# Patient Record
Sex: Male | Born: 1970 | Race: Black or African American | Hispanic: No | Marital: Married | Smoking: Current every day smoker
Health system: Southern US, Community
[De-identification: ages and names within clinical notes are randomized; demographics above are authoritative.]

## PROBLEM LIST (undated history)

## (undated) DIAGNOSIS — I10 Essential (primary) hypertension: Secondary | ICD-10-CM

## (undated) DIAGNOSIS — G459 Transient cerebral ischemic attack, unspecified: Secondary | ICD-10-CM

## (undated) HISTORY — PX: BRAIN SURGERY: SHX531

## (undated) HISTORY — DX: Transient cerebral ischemic attack, unspecified: G45.9

---

## 2015-09-10 DIAGNOSIS — I671 Cerebral aneurysm, nonruptured: Secondary | ICD-10-CM

## 2015-09-10 HISTORY — DX: Cerebral aneurysm, nonruptured: I67.1

## 2020-03-21 ENCOUNTER — Encounter (HOSPITAL_COMMUNITY): Payer: Self-pay

## 2020-03-21 ENCOUNTER — Other Ambulatory Visit: Payer: Self-pay

## 2020-03-21 ENCOUNTER — Emergency Department (HOSPITAL_COMMUNITY)
Admission: EM | Admit: 2020-03-21 | Discharge: 2020-03-21 | Disposition: A | Discharge status: Home / Self Care | Payer: No Typology Code available for payment source | Attending: Emergency Medicine | Admitting: Emergency Medicine

## 2020-03-21 DIAGNOSIS — F10930 Alcohol use, unspecified with withdrawal, uncomplicated: Secondary | ICD-10-CM

## 2020-03-21 DIAGNOSIS — F10230 Alcohol dependence with withdrawal, uncomplicated: Secondary | ICD-10-CM | POA: Diagnosis not present

## 2020-03-21 LAB — COMPREHENSIVE METABOLIC PANEL
ALT: 56 U/L — ABNORMAL HIGH (ref 0–44)
AST: 78 U/L — ABNORMAL HIGH (ref 15–41)
Albumin: 4.3 g/dL (ref 3.5–5.0)
Alkaline Phosphatase: 65 U/L (ref 38–126)
Anion gap: 15 (ref 5–15)
BUN: 9 mg/dL (ref 6–20)
CO2: 27 mmol/L (ref 22–32)
Calcium: 8.9 mg/dL (ref 8.9–10.3)
Chloride: 98 mmol/L (ref 98–111)
Creatinine, Ser: 1.15 mg/dL (ref 0.61–1.24)
GFR calc Af Amer: >60 mL/min (ref >60)
GFR calc non Af Amer: >60 mL/min (ref >60)
Glucose, Bld: 94 mg/dL (ref 70–99)
Potassium: 3.1 mmol/L — ABNORMAL LOW (ref 3.5–5.1)
Sodium: 140 mmol/L (ref 135–145)
Total Bilirubin: 0.8 mg/dL (ref 0.3–1.2)
Total Protein: 6.9 g/dL (ref 6.5–8.1)

## 2020-03-21 LAB — CBC
HCT: 38.3 % — ABNORMAL LOW (ref 39.0–52.0)
Hemoglobin: 12.4 g/dL — ABNORMAL LOW (ref 13.0–17.0)
MCH: 25.7 pg — ABNORMAL LOW (ref 26.0–34.0)
MCHC: 32.4 g/dL (ref 30.0–36.0)
MCV: 79.3 fL — ABNORMAL LOW (ref 80.0–100.0)
Platelets: 224 10*3/uL (ref 150–400)
RBC: 4.83 MIL/uL (ref 4.22–5.81)
RDW: 16 % — ABNORMAL HIGH (ref 11.5–15.5)
WBC: 4.2 10*3/uL (ref 4.0–10.5)
nRBC: 0 % (ref 0.0–0.2)

## 2020-03-21 LAB — ETHANOL: Alcohol, Ethyl (B): <10 mg/dL (ref <10)

## 2020-03-21 MED ORDER — LORAZEPAM 2 MG/ML IJ SOLN: 0.0000 mg | Freq: Two times a day (BID) | INTRAMUSCULAR | Status: DC

## 2020-03-21 MED ORDER — LORAZEPAM 1 MG PO TABS
0.0000 mg | ORAL_TABLET | Freq: Four times a day (QID) | ORAL | Status: DC
  Filled 2020-03-21: qty 2

## 2020-03-21 MED ORDER — LORAZEPAM 1 MG PO TABS: 0.0000 mg | ORAL_TABLET | Freq: Two times a day (BID) | ORAL | Status: DC

## 2020-03-21 MED ORDER — CHLORDIAZEPOXIDE HCL 25 MG PO CAPS
ORAL_CAPSULE | ORAL | 0 refills | Status: DC
Start: 2020-03-21 — End: 2022-02-02

## 2020-03-21 MED ORDER — THIAMINE HCL 100 MG/ML IJ SOLN
Freq: Once | INTRAVENOUS | Status: AC
  Filled 2020-03-21: qty 1000

## 2020-03-21 MED ORDER — LORAZEPAM 2 MG/ML IJ SOLN
0.0000 mg | Freq: Four times a day (QID) | INTRAMUSCULAR | Status: DC
  Administered 2020-03-21 (×2): 2 mg via INTRAVENOUS
  Filled 2020-03-21: qty 1

## 2020-03-21 NOTE — ED Provider Notes (Addendum)
Patient is a 49 year old male presenting with acute alcohol withdrawal, states his last drink was approximately 48 hours ago, he drinks 1 pint of liquor a day.  He has been in alcohol withdrawal in the past and in fact when he was recently incarcerated they were able to treat this in the jail.  When he went to the jail today it was recommended that the patient be cleared in the emergency department to make sure that he can be treated in that setting.  He is having diffuse tremor and on my exam other than the tremor he looks well, normal vital signs, soft abdomen, clear heart and lung sounds and no edema.  He is able to follow commands with a clear mental status and has no signs of delirium tremens.  At this time the patient will have basic labs to make sure that his liver renal and blood counts are okay, he will need to have Ativan, 4 mg will be ordered, short observation, as long as he is improving he can go to finish his treatment at the jail where they can continue treatment for this condition.  This was discussed with the marshals at the bedside and they are in agreement  Medical screening examination/treatment/procedure(s) were conducted as a shared visit with non-physician practitioner(s) and myself.  I personally evaluated the patient during the encounter.  Clinical Impression:   Final diagnoses:  Alcohol withdrawal syndrome without complication (HCC)      Eber Hong, MD 03/21/20 1248    Eber Hong, MD 03/23/20 [phone]

## 2020-03-21 NOTE — Discharge Instructions (Addendum)
You have been seen today for alcohol withdrawal. Please read and follow all provided instructions. Return to the emergency room for worsening condition or new concerning symptoms.    1. Medications:  Prescription printed for Librium taper. This is a medicine to help you avoid alcohol withdrawal symptoms. Take as prescribed.  -If ativan is needed it should be able to be given by medical staff at the jail.  Take medications as prescribed. Please review all of the medicines and only take them if you do not have an allergy to them.       ?

## 2020-03-21 NOTE — ED Provider Notes (Signed)
[facility] EMERGENCY DEPARTMENT Provider Note   CSN: [id number] Arrival date & time: 03/21/20  1147     History Chief Complaint  Patient presents with  . Alcohol Problem    Carl Armstrong is a 49 y.o. male with with past medical history significant for alcohol abuse and cerebral aneurysm.  HPI Patient presents to emergency department today in police custody with chief complaint of alcohol problem. Patient turned himself into the police yesterday. He spent the night in a jail in [facility]. When he was getting ready to go to court this morning staff noticed that he was tremulous and there was concern for alcohol withdrawal. Patient was brought here for further evaluation. No medications given for symptoms prior to arrival. Patient states he typically drinks a pint of liquor each day. His last drink was yesterday morning. He denies any suicidal or homicidal ideations. Denies any visual auditory hallucinations. Also denies any fever, chills, chest pain, shortness of breath, abdominal pain, nausea, vomiting, urinary symptoms, diarrhea.    History reviewed. No pertinent past medical history.  There are no problems to display for this patient.   History reviewed. No pertinent surgical history.     No family history on file.  Social History   Tobacco Use  . Smoking status: Not on file  Substance Use Topics  . Alcohol use: Not on file  . Drug use: Not on file    Home Medications Prior to Admission medications   Medication Sig Start Date End Date Taking? Authorizing Provider  chlordiazePOXIDE (LIBRIUM) 25 MG capsule 50mg  PO TID x 1D, then 25-50mg  PO BID X 1D, then 25-50mg  PO QD X 1D 03/21/20   Denece Shearer, 03/23/20, PA-C    Allergies    Patient has no allergy information on record.  Review of Systems   Review of Systems  All other systems are reviewed and are negative for acute change except as noted in the HPI.   Physical Exam Updated Vital Signs BP  (!) 167/102   Pulse 72   Temp 98.5 F (36.9 C) (Oral)   Resp 16   Ht 5\' 5"  (1.651 m)   Wt 63.5 kg   SpO2 100%   BMI 23.30 kg/m   Physical Exam Vitals and nursing note reviewed.  Constitutional:      General: He is not in acute distress.    Appearance: He is not ill-appearing.  HENT:     Head: Normocephalic and atraumatic.     Right Ear: Tympanic membrane and external ear normal.     Left Ear: Tympanic membrane and external ear normal.     Nose: Nose normal.     Mouth/Throat:     Mouth: Mucous membranes are moist.     Pharynx: Oropharynx is clear.  Eyes:     General: No scleral icterus.       Right eye: No discharge.        Left eye: No discharge.     Extraocular Movements: Extraocular movements intact.     Conjunctiva/sclera: Conjunctivae normal.     Pupils: Pupils are equal, round, and reactive to light.  Neck:     Vascular: No JVD.  Cardiovascular:     Rate and Rhythm: Normal rate and regular rhythm.     Pulses: Normal pulses.          Radial pulses are 2+ on the right side and 2+ on the left side.     Heart sounds: Normal heart sounds.  Pulmonary:     Comments: Lungs clear to auscultation in all fields. Symmetric chest rise. No wheezing, rales, or rhonchi. Abdominal:     Comments: Abdomen is soft, non-distended, and non-tender in all quadrants. No rigidity, no guarding. No peritoneal signs.  Musculoskeletal:        General: Normal range of motion.     Cervical back: Normal range of motion.  Skin:    General: Skin is warm and dry.     Capillary Refill: Capillary refill takes less than 2 seconds.  Neurological:     General: No focal deficit present.     Mental Status: He is oriented to person, place, and time.     GCS: GCS eye subscore is 4. GCS verbal subscore is 5. GCS motor subscore is 6.     Comments: Fluent speech, no facial droop.  Patient with diffuse tremor  Speech is clear and goal oriented, follows commands CN III-XII intact, no facial droop Normal  strength in upper and lower extremities bilaterally including dorsiflexion and plantar flexion, strong and equal grip strength Sensation normal to light and sharp touch     Psychiatric:        Behavior: Behavior normal.     ED Results / Procedures / Treatments   Labs (all labs ordered are listed, but only abnormal results are displayed) Labs Reviewed  COMPREHENSIVE METABOLIC PANEL - Abnormal; Notable for the following components:      Result Value   Potassium 3.1 (*)    AST 78 (*)    ALT 56 (*)    All other components within normal limits  CBC - Abnormal; Notable for the following components:   Hemoglobin 12.4 (*)    HCT 38.3 (*)    MCV 79.3 (*)    MCH 25.7 (*)    RDW 16.0 (*)    All other components within normal limits  ETHANOL    EKG EKG Interpretation  Date/Time:  Tuesday March 21 2020 11:58:23 EDT Ventricular Rate:  73 PR Interval:  140 QRS Duration: 88 QT Interval:  384 QTC Calculation: 423 R Axis:   94 Text Interpretation: Normal sinus rhythm Rightward axis Nonspecific ST abnormality Abnormal ECG No old tracing to compare Confirmed by Eber Hong ([id number]) on 03/21/2020 12:15:28 PM   Radiology No results found.  Procedures Procedures (including critical care time)  Medications Ordered in ED Medications  LORazepam (ATIVAN) injection 0-4 mg (2 mg Intravenous Given 03/21/20 1344)    Or  LORazepam (ATIVAN) tablet 0-4 mg ( Oral See Alternative 03/21/20 1344)  LORazepam (ATIVAN) injection 0-4 mg (has no administration in time range)    Or  LORazepam (ATIVAN) tablet 0-4 mg (has no administration in time range)  sodium chloride 0.9 % 1,000 mL with thiamine 100 mg, folic acid 1 mg, multivitamins adult 10 mL infusion ( Intravenous New Bag/Given 03/21/20 1343)    ED Course  I have reviewed the triage vital signs and the nursing notes.  Pertinent labs & imaging results that were available during my care of the patient were reviewed by me and considered in my  medical decision making (see chart for details).    MDM Rules/Calculators/A&P                          History provided by patient and Marshals at the bedsidewith additional history obtained from chart review.    49 year old male presenting with alcohol withdrawal. Patient presents awake, alert, hemodynamically stable, afebrile,  non toxic. On exam he has diffuse tremor. He is otherwise well-appearing. Abdomen is nontender, no peritoneal signs. Mental status is clear. Screening labs were ordered and show no severe electrolyte derangement, no renal insufficiency. Liver enzymes are slightly elevated as expected with history of alcoholism, normal anion gap. Labs do not indicate alcoholic ketoacidosis. Banana bag ordered.  Patient received 4 mg of Ativan.  On reassessment tremor has resolved.  Patient is stable to be discharged with Marshals to jail. Librium prescription given.  Strict return precautions discussed. Marshals agree with plan of care.  The patient was discussed with and seen by Dr. Hyacinth Meeker who agrees with the treatment plan.   Portions of this note were generated with Scientist, clinical (histocompatibility and immunogenetics). Dictation errors may occur despite best attempts at proofreading.    Final Clinical Impression(s) / ED Diagnoses Final diagnoses:  Alcohol withdrawal syndrome without complication [facility])    Rx / DC Orders ED Discharge Orders         Ordered    chlordiazePOXIDE (LIBRIUM) 25 MG capsule     Discontinue  Reprint     03/21/20 1443           Sherene Sires, PA-C 03/21/20 1504    Eber Hong, MD 03/23/20 [phone]

## 2020-03-21 NOTE — ED Triage Notes (Signed)
Pt reports alcohol withdrawal, last drink was Monday night, drinks about 1 pint of liquor each night. Denies any drug use. CIWA 17

## 2022-02-02 ENCOUNTER — Encounter (HOSPITAL_COMMUNITY): Payer: Self-pay | Admitting: Emergency Medicine

## 2022-02-02 ENCOUNTER — Emergency Department (HOSPITAL_COMMUNITY)

## 2022-02-02 ENCOUNTER — Inpatient Hospital Stay (HOSPITAL_COMMUNITY)
Admission: EM | Admit: 2022-02-02 | Discharge: 2022-02-05 | DRG: 065 | Disposition: A | Discharge status: Home / Self Care | Attending: Family Medicine | Admitting: Family Medicine

## 2022-02-02 ENCOUNTER — Other Ambulatory Visit: Payer: Self-pay

## 2022-02-02 DIAGNOSIS — I10 Essential (primary) hypertension: Secondary | ICD-10-CM

## 2022-02-02 DIAGNOSIS — I63511 Cerebral infarction due to unspecified occlusion or stenosis of right middle cerebral artery: Principal | ICD-10-CM | POA: Diagnosis present

## 2022-02-02 DIAGNOSIS — G459 Transient cerebral ischemic attack, unspecified: Principal | ICD-10-CM

## 2022-02-02 DIAGNOSIS — I671 Cerebral aneurysm, nonruptured: Secondary | ICD-10-CM | POA: Diagnosis present

## 2022-02-02 DIAGNOSIS — Z8673 Personal history of transient ischemic attack (TIA), and cerebral infarction without residual deficits: Secondary | ICD-10-CM

## 2022-02-02 DIAGNOSIS — Z8679 Personal history of other diseases of the circulatory system: Secondary | ICD-10-CM

## 2022-02-02 DIAGNOSIS — I639 Cerebral infarction, unspecified: Principal | ICD-10-CM

## 2022-02-02 DIAGNOSIS — R531 Weakness: Secondary | ICD-10-CM

## 2022-02-02 DIAGNOSIS — R29700 NIHSS score 0: Secondary | ICD-10-CM

## 2022-02-02 DIAGNOSIS — Z9889 Other specified postprocedural states: Secondary | ICD-10-CM

## 2022-02-02 DIAGNOSIS — I63411 Cerebral infarction due to embolism of right middle cerebral artery: Secondary | ICD-10-CM

## 2022-02-02 DIAGNOSIS — I6310 Cerebral infarction due to embolism of unspecified precerebral artery: Secondary | ICD-10-CM

## 2022-02-02 DIAGNOSIS — G8194 Hemiplegia, unspecified affecting left nondominant side: Secondary | ICD-10-CM | POA: Diagnosis present

## 2022-02-02 DIAGNOSIS — R200 Anesthesia of skin: Secondary | ICD-10-CM | POA: Diagnosis not present

## 2022-02-02 DIAGNOSIS — R7303 Prediabetes: Secondary | ICD-10-CM

## 2022-02-02 DIAGNOSIS — Z79899 Other long term (current) drug therapy: Secondary | ICD-10-CM

## 2022-02-02 DIAGNOSIS — F17210 Nicotine dependence, cigarettes, uncomplicated: Secondary | ICD-10-CM | POA: Diagnosis present

## 2022-02-02 DIAGNOSIS — Z7982 Long term (current) use of aspirin: Secondary | ICD-10-CM

## 2022-02-02 HISTORY — DX: Essential (primary) hypertension: I10

## 2022-02-02 LAB — BASIC METABOLIC PANEL
Anion gap: 8 (ref 5–15)
BUN: 8 mg/dL (ref 6–20)
CO2: 28 mmol/L (ref 22–32)
Calcium: 8.6 mg/dL — ABNORMAL LOW (ref 8.9–10.3)
Chloride: 103 mmol/L (ref 98–111)
Creatinine, Ser: 1.14 mg/dL (ref 0.61–1.24)
GFR, Estimated: >60 mL/min (ref >60)
Glucose, Bld: 101 mg/dL — ABNORMAL HIGH (ref 70–99)
Potassium: 3.7 mmol/L (ref 3.5–5.1)
Sodium: 139 mmol/L (ref 135–145)

## 2022-02-02 LAB — CBC WITH DIFFERENTIAL/PLATELET
Abs Immature Granulocytes: 0.06 10*3/uL (ref 0.00–0.07)
Basophils Absolute: 0 10*3/uL (ref 0.0–0.1)
Basophils Relative: 0 %
Eosinophils Absolute: 0 10*3/uL (ref 0.0–0.5)
Eosinophils Relative: 0 %
HCT: 40.3 % (ref 39.0–52.0)
Hemoglobin: 13 g/dL (ref 13.0–17.0)
Immature Granulocytes: 1 %
Lymphocytes Relative: 19 %
Lymphs Abs: 2.1 10*3/uL (ref 0.7–4.0)
MCH: 23.9 pg — ABNORMAL LOW (ref 26.0–34.0)
MCHC: 32.3 g/dL (ref 30.0–36.0)
MCV: 73.9 fL — ABNORMAL LOW (ref 80.0–100.0)
Monocytes Absolute: 0.5 10*3/uL (ref 0.1–1.0)
Monocytes Relative: 4 %
Neutro Abs: 8.7 10*3/uL — ABNORMAL HIGH (ref 1.7–7.7)
Neutrophils Relative %: 76 %
Platelets: 357 10*3/uL (ref 150–400)
RBC: 5.45 MIL/uL (ref 4.22–5.81)
RDW: 14.6 % (ref 11.5–15.5)
WBC: 11.4 10*3/uL — ABNORMAL HIGH (ref 4.0–10.5)
nRBC: 0 % (ref 0.0–0.2)

## 2022-02-02 IMAGING — MR MR HEAD W/O CM
6 of 9 series · 29 of 48 positions shown · non-contrast
Comparison: None Available.

CLINICAL DATA: Left-sided weakness since last night

EXAM:
MRI HEAD WITHOUT CONTRAST
TECHNIQUE: Multiplanar, multiecho pulse sequences of the brain and surrounding
structures were obtained without intravenous contrast.

[Series 2: DWI · axial · 3.0mm · 0.94mm/px · z∈[-123,+14]mm · 8 of 96 slices shown (1 of 2)]
[im 1/96]
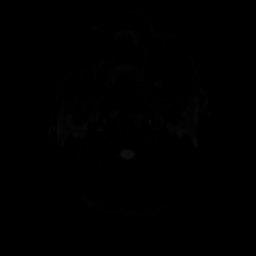
[im 11/96]
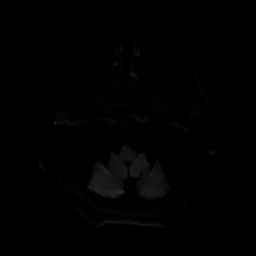
[im 32/96]
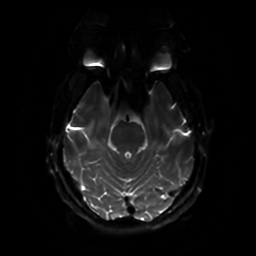
[im 43/96]
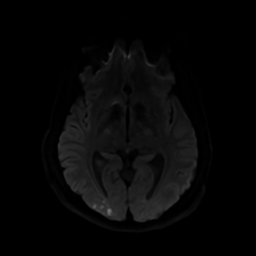
[im 53/96]
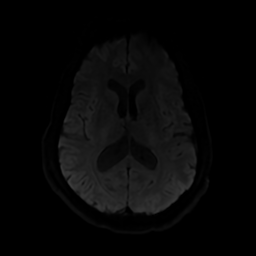
[im 64/96]
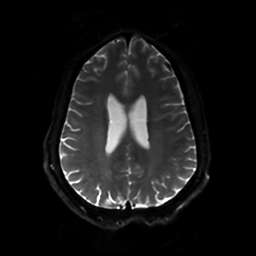
[im 85/96]
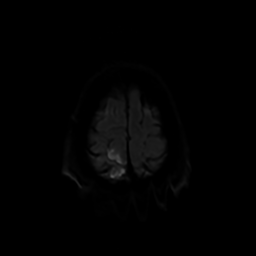
[im 96/96]
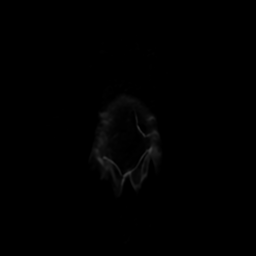

[Series 3: DWI · coronal · 4.0mm · 0.94mm/px · 7 of 74 slices shown (2 of 2)]
[im 1/74]
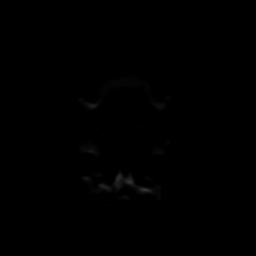
[im 13/74]
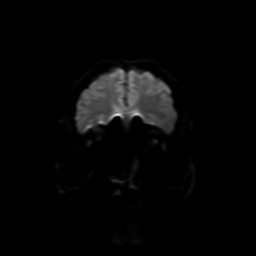
[im 25/74]
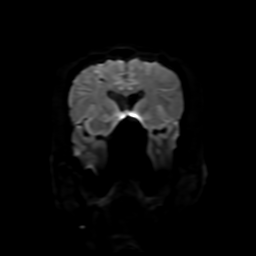
[im 37/74]
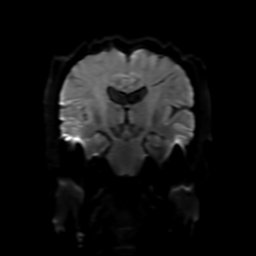
[im 49/74]
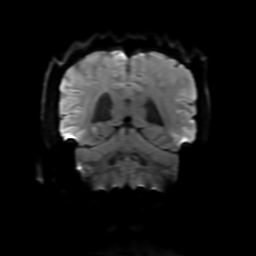
[im 61/74]
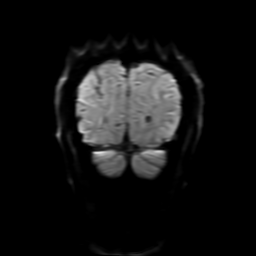
[im 74/74]
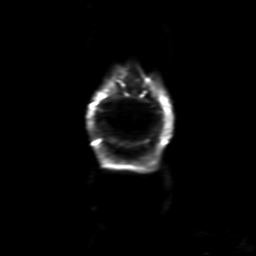

[Series 4: FLAIR · sagittal · 5.0mm · 0.23mm/px · 2 of 24 slices shown (1 of 2)]
[im 1/24]
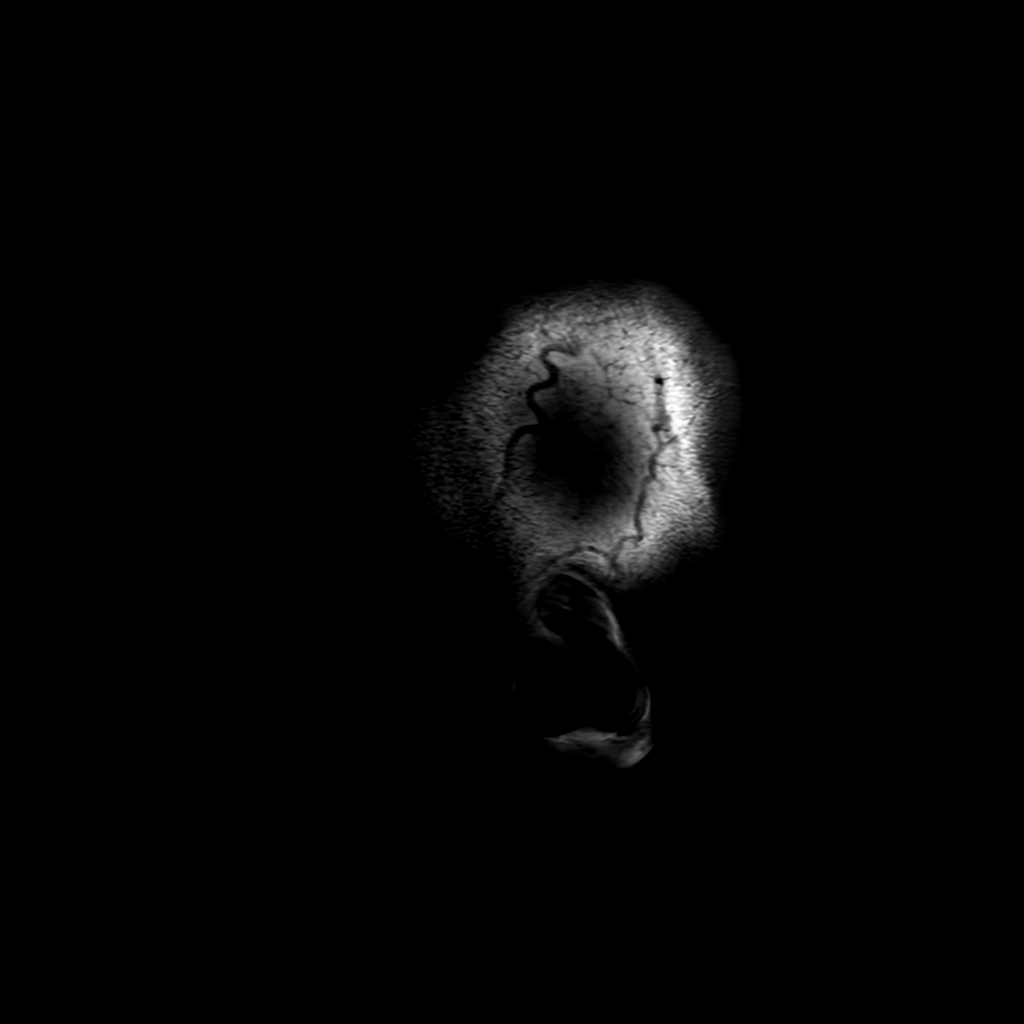
[im 24/24]
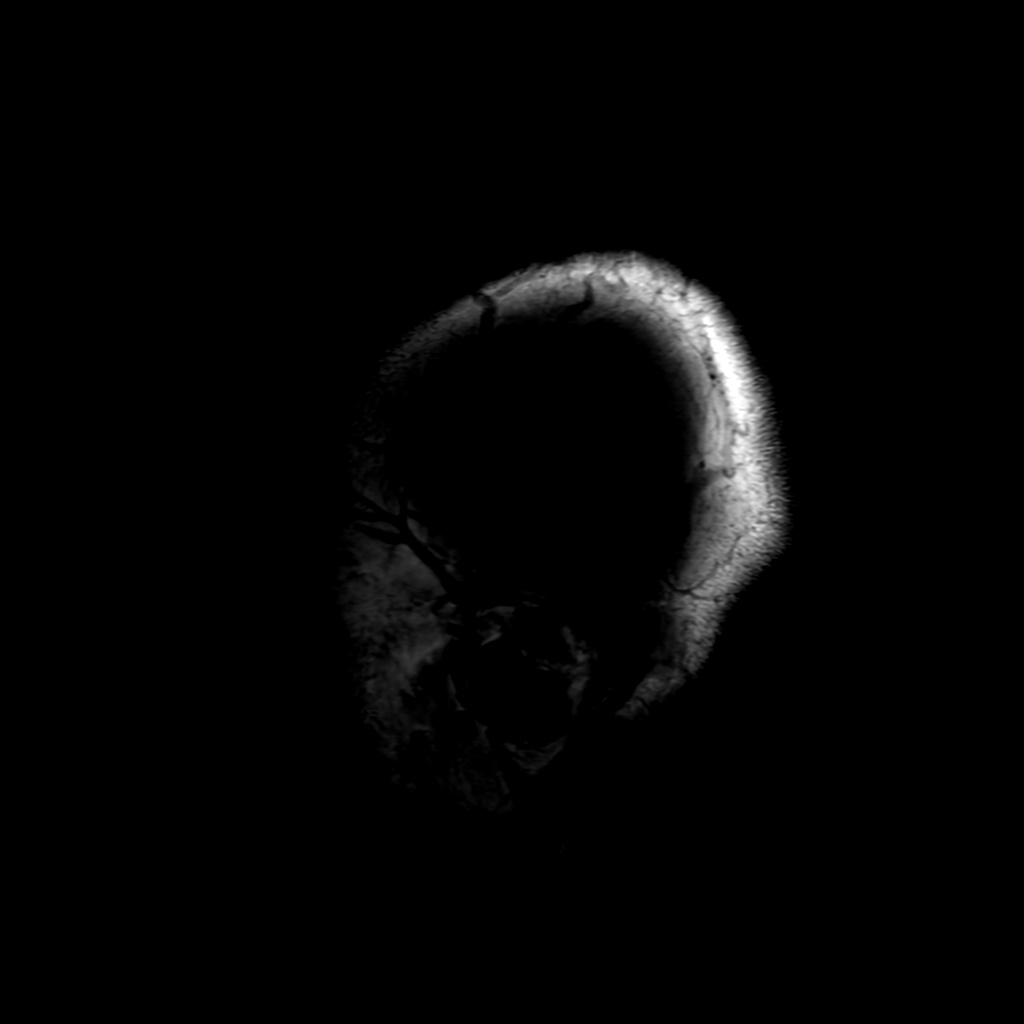

[Series 6: FLAIR · axial · 4.0mm · 0.45mm/px · z∈[-123,+15]mm · 3 of 34 slices shown (2 of 2)]
[im 1/34]
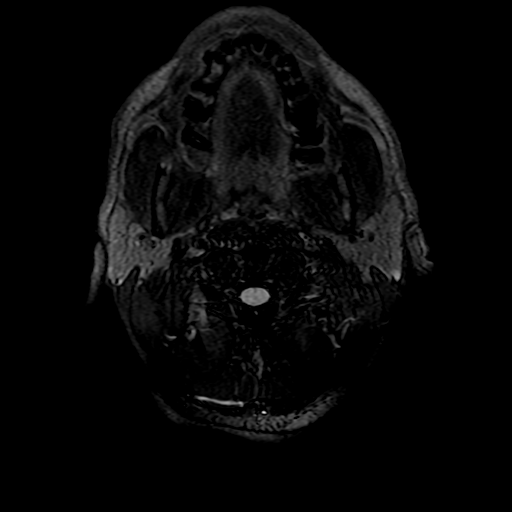
[im 17/34]
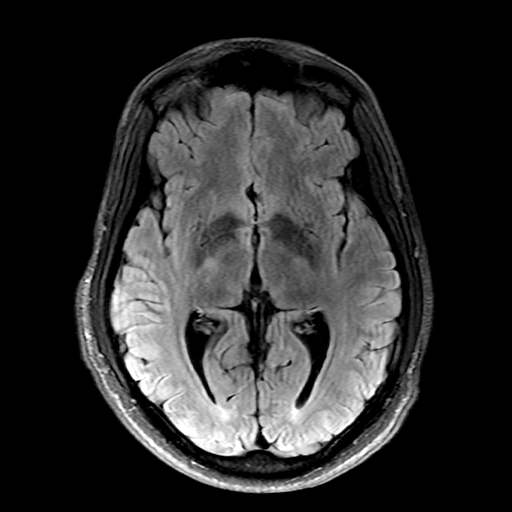
[im 34/34]
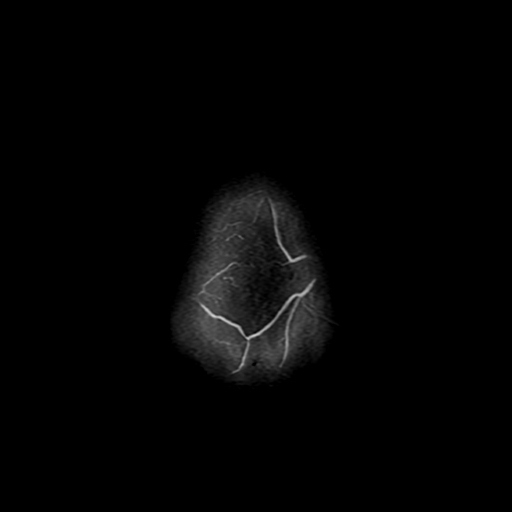

[Series 250: ADC · axial · 3.0mm · 0.94mm/px · z∈[-123,+14]mm · 5 of 49 slices shown (1 of 2)]
[im 1/49]
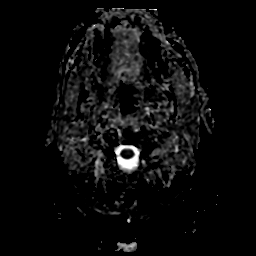
[im 13/49]
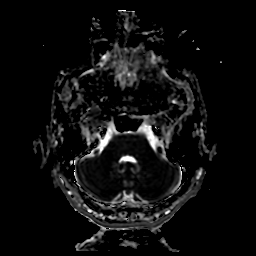
[im 25/49]
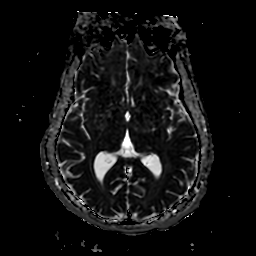
[im 37/49]
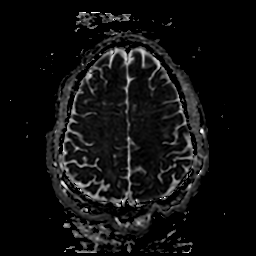
[im 49/49]
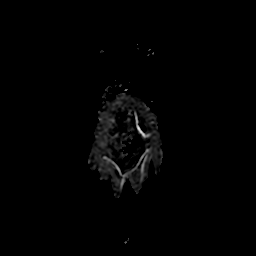

[Series 350: ADC · coronal · 4.0mm · 0.94mm/px · 4 of 37 slices shown (2 of 2)]
[im 1/37]
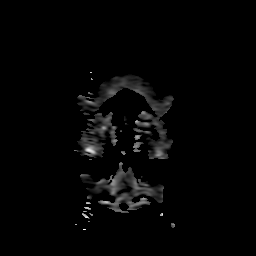
[im 13/37]
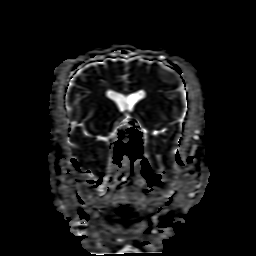
[im 25/37]
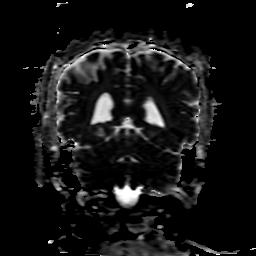
[im 37/37]
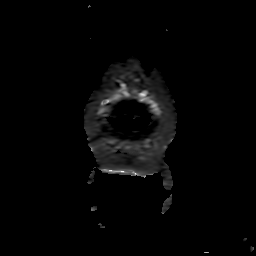

[29 of 48 positions shown; findings below may reference images not displayed]

FINDINGS: Brain: Small acute cortical infarcts scattered along the right
superior frontal, superior parietal, and upper occipital cortex,
right MCA periphery/watershed distribution. Mild petechial
hemorrhage associated with some of the infarcts. No hematoma,
hydrocephalus, collection, or masslike finding.

Vascular: Major flow voids are preserved

Skull and upper cervical spine: No focal marrow lesion.

Sinuses/Orbits: Negative
IMPRESSION: Scattered acute infarcts along the right cerebral convexity, MCA
watershed distribution.

## 2022-02-02 MED ORDER — ASPIRIN 81 MG PO TBEC
81.0000 mg | DELAYED_RELEASE_TABLET | Freq: Every day | ORAL | Status: DC
  Administered 2022-02-02 – 2022-02-05 (×4): 81 mg via ORAL
  Filled 2022-02-02 (×4): qty 1

## 2022-02-02 MED ORDER — ENOXAPARIN SODIUM 40 MG/0.4ML IJ SOSY
40.0000 mg | PREFILLED_SYRINGE | INTRAMUSCULAR | Status: DC
  Administered 2022-02-02 – 2022-02-03 (×2): 40 mg via SUBCUTANEOUS
  Filled 2022-02-02 (×4): qty 0.4

## 2022-02-02 MED ORDER — ACETAMINOPHEN 650 MG RE SUPP: 650.0000 mg | Freq: Four times a day (QID) | RECTAL | Status: DC | PRN

## 2022-02-02 MED ORDER — IBUPROFEN 400 MG PO TABS
400.0000 mg | ORAL_TABLET | Freq: Once | ORAL | Status: AC
Start: 2022-02-02 — End: 2022-02-02
  Administered 2022-02-02: 400 mg via ORAL
  Filled 2022-02-02: qty 1

## 2022-02-02 MED ORDER — ACETAMINOPHEN 325 MG PO TABS
650.0000 mg | ORAL_TABLET | Freq: Four times a day (QID) | ORAL | Status: DC | PRN
  Administered 2022-02-02 – 2022-02-03 (×4): 650 mg via ORAL
  Filled 2022-02-02 (×4): qty 2

## 2022-02-02 MED ORDER — MIRTAZAPINE 15 MG PO TABS
15.0000 mg | ORAL_TABLET | Freq: Every day | ORAL | Status: DC
Start: 2022-02-02 — End: 2022-02-05
  Administered 2022-02-02 – 2022-02-04 (×3): 15 mg via ORAL
  Filled 2022-02-02 (×3): qty 1

## 2022-02-02 MED ORDER — CLOPIDOGREL BISULFATE 75 MG PO TABS
75.0000 mg | ORAL_TABLET | Freq: Every day | ORAL | Status: DC
  Administered 2022-02-02 – 2022-02-05 (×4): 75 mg via ORAL
  Filled 2022-02-02 (×4): qty 1

## 2022-02-02 MED ORDER — IOHEXOL 350 MG/ML SOLN
75.0000 mL | Freq: Once | INTRAVENOUS | Status: AC | PRN
  Administered 2022-02-02: 75 mL via INTRAVENOUS

## 2022-02-02 MED ORDER — ACETAMINOPHEN 325 MG PO TABS
650.0000 mg | ORAL_TABLET | Freq: Once | ORAL | Status: AC
  Administered 2022-02-02: 650 mg via ORAL
  Filled 2022-02-02: qty 2

## 2022-02-02 MED ORDER — LORAZEPAM 1 MG PO TABS
1.0000 mg | ORAL_TABLET | Freq: Once | ORAL | Status: AC
Start: 2022-02-02 — End: 2022-02-02
  Administered 2022-02-02: 1 mg via ORAL
  Filled 2022-02-02: qty 1

## 2022-02-02 MED ORDER — IBUPROFEN 400 MG PO TABS
400.0000 mg | ORAL_TABLET | Freq: Once | ORAL | Status: AC
  Administered 2022-02-02: 400 mg via ORAL
  Filled 2022-02-02: qty 1

## 2022-02-02 NOTE — ED Provider Notes (Signed)
[facility] EMERGENCY DEPARTMENT Provider Note   CSN: [id number] Arrival date & time: 02/02/22  1937     History  Chief Complaint  Patient presents with   Numbness    Carl Armstrong is a 51 y.o. male.  The history is provided by the patient.  He has history of hypertension, cerebral aneurysm and comes in complaining of left-sided headache and inability to control his left leg starting at about 11 PM.  He has history of TIAs which usually manifest in the same way.  He also had some slight problem with his left arm, but not nearly as severe.  He has been evaluated for the TIAs in the past.  Currently, headache has resolved.  He denies any nausea or vomiting.   Home Medications Prior to Admission medications   Medication Sig Start Date End Date Taking? Authorizing Provider  aspirin EC 81 MG tablet Take 81 mg by mouth daily. Swallow whole.   Yes [provider]  losartan (COZAAR) 25 MG tablet Take 25 mg by mouth daily.   Yes [provider]  mirtazapine (REMERON) 15 MG tablet Take 15 mg by mouth at bedtime.   Yes [provider]      Allergies    Patient has no known allergies.    Review of Systems   Review of Systems  All other systems reviewed and are negative.  Physical Exam Updated Vital Signs BP 101/66   Pulse 85   Temp 97.7 F (36.5 C) (Oral)   Resp (!) 25   Ht 5\' 5"  (1.651 m)   Wt 74.8 kg   SpO2 96%   BMI 27.46 kg/m  Physical Exam Vitals and nursing note reviewed.  51 year old male, resting comfortably and in no acute distress. Vital signs are significant for elevated respiratory rate. Oxygen saturation is 96%, which is normal. Head is normocephalic and atraumatic. PERRLA, EOMI. Oropharynx is clear. Neck is nontender and supple without adenopathy or JVD. Back is nontender and there is no CVA tenderness. Lungs are clear without rales, wheezes, or rhonchi. Chest is nontender. Heart has regular rate and rhythm without  murmur. Abdomen is soft, flat, nontender. Extremities have no cyanosis or edema, full range of motion is present. Skin is warm and dry without rash. Neurologic: Mental status is normal, cranial nerves are intact, strength is 5/5 in all 4 extremities, there is no pronator drift, there is no extinction on double-simultaneous stimulation.  ED Results / Procedures / Treatments   Labs (all labs ordered are listed, but only abnormal results are displayed) Labs Reviewed  BASIC METABOLIC PANEL - Abnormal; Notable for the following components:      Result Value   Glucose, Bld 101 (*)    Calcium 8.6 (*)    All other components within normal limits  CBC WITH DIFFERENTIAL/PLATELET - Abnormal; Notable for the following components:   WBC 11.4 (*)    MCV 73.9 (*)    MCH 23.9 (*)    Neutro Abs 8.7 (*)    All other components within normal limits    EKG None  Radiology No results found.  Procedures Procedures    Medications Ordered in ED Medications  ibuprofen (ADVIL) tablet 400 mg (400 mg Oral Given 02/02/22 0546)  acetaminophen (TYLENOL) tablet 650 mg (650 mg Oral Given 02/02/22 0547)  LORazepam (ATIVAN) tablet 1 mg (1 mg Oral Given 02/02/22 0549)    ED Course/ Medical Decision Making/ A&P  Medical Decision Making Amount and/or Complexity of Data Reviewed Labs: ordered. Radiology: ordered.  Risk OTC drugs. Prescription drug management.   Left leg weakness which has resolved, apparent transient ischemic attack.  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  Patient appears to be back to his baseline.  No need for work-up at this point.  We will ambulate to make sure that he has appropriate leg function.  Patient is able to ambulate, but states that it is not quite back to his baseline.  Therefore, will send for MRI to rule out stroke.  He also is  complaining of headache again, he is given acetaminophen and ibuprofen for his headache.  I have reviewed and interpreted the laboratory testing, and CBC and metabolic panel are significant only for mild leukocytosis, which is nonspecific.  MRI is still pending.  Case is signed out to Dr. Durwin Nora.  Final Clinical Impression(s) / ED Diagnoses Final diagnoses:  TIA (transient ischemic attack)    Rx / DC Orders ED Discharge Orders     None         Dione Booze, MD 02/02/22 [phone]

## 2022-02-02 NOTE — ED Provider Notes (Signed)
Patient with history of coiled aneurysm, presenting for headache and left leg numbness.  Symptoms have nearly resolved on arrival.  Currently awaiting MRI for CVA/TIA work-up.  On ASA.  Discharge if negative. Physical Exam  BP 115/67   Pulse 74   Temp 97.7 F (36.5 C) (Oral)   Resp 20   Ht 5\' 5"  (1.651 m)   Wt 74.8 kg   SpO2 99%   BMI 27.46 kg/m   Physical Exam Vitals and nursing note reviewed.  Constitutional:      General: He is not in acute distress.    Appearance: Normal appearance. He is well-developed and normal weight. He is not ill-appearing, toxic-appearing or diaphoretic.  HENT:     Head: Normocephalic and atraumatic.     Right Ear: External ear normal.     Left Ear: External ear normal.     Nose: Nose normal.     Mouth/Throat:     Mouth: Mucous membranes are moist.     Pharynx: Oropharynx is clear.  Eyes:     Extraocular Movements: Extraocular movements intact.     Conjunctiva/sclera: Conjunctivae normal.  Cardiovascular:     Rate and Rhythm: Normal rate and regular rhythm.     Heart sounds: No murmur heard. Pulmonary:     Effort: Pulmonary effort is normal. No respiratory distress.  Abdominal:     General: There is no distension.     Palpations: Abdomen is soft.  Musculoskeletal:        General: No swelling or deformity.     Cervical back: Normal range of motion. No rigidity.     Right lower leg: No edema.     Left lower leg: No edema.  Skin:    General: Skin is warm and dry.  Neurological:     Mental Status: He is alert.     Cranial Nerves: Cranial nerves 2-12 are intact. No dysarthria or facial asymmetry.     Motor: Weakness (Left ankle dorsiflexion and plantarflexion) present. No pronator drift.     Coordination: Coordination is intact.  Psychiatric:        Mood and Affect: Mood normal.        Behavior: Behavior normal.    Procedures  Procedures  ED Course / MDM    Medical Decision Making Amount and/or Complexity of Data Reviewed Labs:  ordered. Radiology: ordered.  Risk OTC drugs. Prescription drug management. Decision regarding hospitalization.   On assessment, patient resting comfortably.  He continues to endorse LLE weakness.  On exam, this appears to be most prominent in plantarflexion and dorsiflexion of left ankle.  On MRI, patient does appear to have scattered acute infarcts in the distribution of right MCA watershed area.  Case was discussed with neurology who recommends CTA of head and neck and admission for further work-up.  Imaging studies were ordered and patient was admitted.       , MD 02/02/22 [phone]

## 2022-02-02 NOTE — ED Notes (Addendum)
Wrong pt

## 2022-02-02 NOTE — Progress Notes (Signed)
[facility] Interim Progress Note  S:Received page that patient's CIWA is 9 and he is anxious. Went to bedside to discuss with patient, patient was no where to be found in the room or restroom. Discussed with nurse, called the patient and he says that he is in the hallway walking. Patient arrives to the unit and we walk to the room together. He shares that he is just anxious being here as it is not an ideal situation. He is worried that he will not recover. Denies any other concerns or symptoms at this time.   O: BP 116/77 (BP Location: Right Arm)   Pulse 75   Temp 98.1 F (36.7 C) (Oral)   Resp 19   Ht 5\' 5"  (1.651 m)   Wt 70.3 kg   SpO2 95%   BMI 25.79 kg/m   General: Patient well-appearing, in no acute distress. Resp: normal work of breathing Neuro: decreased sensation in left LE, normal gait, ambulates with assistance of walker Psych: mildly anxious, no agitation noted, pleasant   A/P: Patient admitted today for acute stroke with imaging demonstrating acute right MCA infarct. Neurology consulted and following, appreciate continued involvement and recommendations. Vitals reviewed and stable,will monitor BP overnight. Patient is worried about requiring yet another hospitalization and concerned that he may not recover. After extensive discussion and reassurance provided, patient is agreeable to stay. I emphasized the importance of this hospital stay to continue complete work up to ensure he is appropriately treated and continued risk modification to prevent future stroke. I explained the importance of medication compliance as well as continuous telemetry which he voices understand of. Remote history of alcohol use so CIWAs in place. CIWA score of 1 for anxiety. Low concern that this is due to alcohol withdrawal as his last drink was 2 years ago and he is quite anxious with this hospitalization. Instructed nurse to administer home remeron so patient can rest comfortably throughout the night. Remainder  of plan per day team.     , DO 02/02/2022, 8:53 PM PGY-2, [facility] pager [phone]

## 2022-02-02 NOTE — ED Notes (Signed)
Provider at bedside, provided pt with something to drink

## 2022-02-02 NOTE — H&P (Signed)
Family Medicine Teaching [facility] Admission History and Physical Service Pager: [phone]  Patient name: Carl Armstrong Medical record number: [id number] Date of birth: 1971-07-23 Age: 51 y.o. Gender: male  Primary Care Provider: Patient, No Pcp Per (Inactive) Consultants: Neurology Code Status: Full code Preferred Emergency Contact: Carl Armstrong wife and Carl Armstrong daughter  [phone]  Chief Complaint: left sided weakness  Assessment and Plan: Carl Armstrong is a 51 y.o. male presenting with acute stroke . 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  Patient on MRI brain showed scattered acute infarcts along the right cerebral convexity in the MCA watershed distribution.  CT angio head and neck showed stent in right intracranial ICA aneurysm with coiling and patent stent in place.  No stenosis or embolic source was seen arterial circulation.  There was asymmetric plaque of enhancement for the right cavernous sinus that was likely chronic.  Vitals have remained stable patient is afebrile, heart rate 70s to 80s, blood pressures have been normotensive to soft and saturating well on room air. Labs in ED showed normal electrolytes, WBC 11.4, Hgb 13. On examination patient has L foot drop unable to dorsiflex or plantarflex and some decreased sensation on left lower leg. Neurology saw patient in ED and recommended plavix for 3 weeks and aspirin 81 mg daily.  -Admit to FP TS, attending Dr. Pollie Meyer, progressive -Neuro checks q2h  -Risk stratification labs A1c, lipid panel, TSH -Echo -Aspirin 81 mg daily -Plavix 75 mg daily for 3 weeks per neurology -Cardiac monitoring -monitor BP, goals per neurology -Consider adding statin tomorrow -PT/OT -Diet: Past bedside swallow, heart healthy -DVT prophylaxis: Lovenox  History of SAH  in 2017  R ICA aneurysm x2 s/p coiled with subsequent PED placement On MRI showed CT angio head and neck showed patent stent and right ICA with coiling.  Patient denies any severe headache currently. -Consider further imaging for patency of stented aneurysm -Neurology following, appreciate recommendations  HTN Home medications of losartan 25 mg daily, patient says he also takes HCTZ however not noted in his medication reconciliation.  Blood pressures here have been 91-139/53-84. -Hold home antihypertensives -Monitor BP  HLD Last lipid panel with total cholesterol of 199, LDL of 107, TG of 74.  Patient says he has never been on a statin medication before. -Lipid panel -Consider adding high-dose statin tomorrow -LDL goal <70  Prediabetes Last A1c of 5.7 in 2021. -A1c -Heart healthy diet  Tobacco use Smokes little less than a pack a day for about 20 years. -Encourage cessation -Consider nicotine patch  History of alcohol withdrawal Patient had history of alcohol withdrawal in 2021 and was given Ativan and Librium did not have a DT.  Says that his last drink was 2 years ago. -CIWA protocol without Ativan  FEN/GI: Heart healthy Prophylaxis: Lovenox  Disposition: Progressive  History of Present Illness:  Carl Armstrong is a 51 y.o. male presenting with left sided weakness.  Started having numbness and weakness on left side arm and leg for and started at 11:30 pm last night. Ambulance brought him in. Walking around at night and started feelign this way this way. Dizziness during this event but no fall. No headaches or vision changes. Vomiting last night (the food he had ate for dinner) a lot but none today. Holding everything down now. No facial droop he noticed or  speech changes.   Loose stool yesterday x 1. None today  February 2017 [facility], TIA at end of January 2023 in [place]. He said he ad some other TIAs that he didn't goo to hospital for before.   Tobacco-smoke 20 years <1  ppd Alcohol-2 years ago last drink Drug-no drug use, no IVDU ever  Lives with wife and daughter  Meds confirmed in room: HCTZ-unsure of dosage Losartan-25 mg Remeron 15 mg ASA 81 mg No statin   Review Of Systems: Per HPI with the following additions:   Review of Systems  Constitutional:  Negative for fever.  HENT:  Negative for congestion, sore throat and trouble swallowing.   Eyes:  Negative for visual disturbance.  Respiratory:  Negative for cough and shortness of breath.   Cardiovascular:  Negative for chest pain, palpitations and leg swelling.  Gastrointestinal:  Positive for vomiting. Negative for abdominal pain, constipation and diarrhea.  Genitourinary:  Negative for dysuria.  Musculoskeletal:  Negative for joint swelling.  Skin:  Negative for rash.  Neurological:  Negative for dizziness and headaches.    Patient Active Problem List   Diagnosis Date Noted   Stroke [facility]) 02/02/2022    Past Medical History: Past Medical History:  Diagnosis Date   Brain aneurysm 2017   Hypertension     Past Surgical History: Past Surgical History:  Procedure Laterality Date   BRAIN SURGERY      Social History: Social History   Tobacco Use   Smoking status: Every Day    Types: Cigarettes   Smokeless tobacco: Never  Substance Use Topics   Alcohol use: Not Currently   Drug use: Not Currently   Additional social history: see HPI  Please also refer to relevant sections of EMR.  Family History: History reviewed. No pertinent family history.  Allergies and Medications: No Known Allergies No current facility-administered medications on file prior to encounter.   Current Outpatient Medications on File Prior to Encounter  Medication Sig Dispense Refill   aspirin EC 81 MG tablet Take 81 mg by mouth daily. Swallow whole.     losartan (COZAAR) 25 MG tablet Take 25 mg by mouth daily.     mirtazapine (REMERON) 15 MG tablet Take 15 mg by mouth at bedtime.       Objective: BP 106/76   Pulse 73   Temp 98.5 F (36.9 C) (Oral)   Resp 13   Ht 5\' 5"  (1.651 m)   Wt 74.8 kg   SpO2 97%   BMI 27.46 kg/m  Exam: General: NAD, laying in bed comfortably A&O x 4 Eyes: EOMI, pupils equal and reactive ENTM: mucous membranes moist, no nasal drainage Neck: ROM normal Cardiovascular: RRR no m/r/g 2+ radial pulses Respiratory: Clear to auscultation bilaterally no wheezes rales or crackles Gastrointestinal:, Nondistended, soft, normal active bowel sounds MSK: No lower extremity edema, Left foot drop unable to dorsiflex or plantarflex. Derm: No rashes or wounds noted, tattoos over abdomen Neuro: CN II: PERRL CN III, IV,VI: EOMI CV V: Normal sensation in V1, V2, V3 CVII: Symmetric smile and brow raise CN VIII: Normal hearing CN IX,X: Symmetric palate raise  CN XI: 5/5 shoulder shrug CN XII: Symmetric tongue protrusion  UE and LE strength 5/5 except L foot plantarflex or dorsiflex  Normal sensation in UE and LLE decreased sensation No ataxia with finger to nose Psych: Mood appropriate  Labs and Imaging: CBC BMET  Recent Labs  Lab 02/02/22 0552  WBC 11.4*  HGB 13.0  HCT 40.3  PLT 357   Recent Labs  Lab 02/02/22 0552  NA 139  K 3.7  CL 103  CO2 28  BUN 8  CREATININE 1.14  GLUCOSE 101*  CALCIUM 8.6*     EKG: none obtained  CT ANGIO HEAD NECK W WO CM  Result Date: 02/02/2022 CLINICAL DATA:  Stroke follow-up EXAM: CT ANGIOGRAPHY HEAD AND NECK TECHNIQUE: Multidetector CT imaging of the head and neck was performed using the standard protocol during bolus administration of intravenous contrast. Multiplanar CT image reconstructions and MIPs were obtained to evaluate the vascular anatomy. Carotid stenosis measurements (when applicable) are obtained utilizing NASCET criteria, using the distal internal carotid diameter as the denominator. RADIATION DOSE REDUCTION: This exam was performed according to the departmental dose-optimization program  which includes automated exposure control, adjustment of the mA and/or kV according to patient size and/or use of iterative reconstruction technique. CONTRAST:  21mL OMNIPAQUE IOHEXOL 350 MG/ML SOLN COMPARISON:  Preceding brain MRI FINDINGS: CT HEAD FINDINGS Brain: Acute cortical infarcts along the right cerebral convexity, underestimated relative to prior MRI. No acute hemorrhage, hydrocephalus, or masslike finding. Vascular: See below Skull: Negative Sinuses: Clear Orbits: Negative Review of the MIP images confirms the above findings CTA NECK FINDINGS Aortic arch: Limited coverage is negative.  Three vessel branching. Right carotid system: Vessels are smooth and diffusely patent without atheromatous change. Left carotid system: Vessels are smooth and diffusely patent without atheromatous change. Vertebral arteries: No proximal subclavian stenosis. Both vertebral arteries are smoothly contoured and widely patent to the dura. Skeleton: No acute finding. Other neck: No evidence of mass or inflammation Upper chest: Biapical centrilobular emphysema. Review of the MIP images confirms the above findings CTA HEAD FINDINGS Anterior circulation: Stent assisted right ICA aneurysm coiling with 2 coil masses. There is unavoidable artifact at the level of the stent and coil masses. Visible flow within the stent and no downstream diminished flow. Dominant right A1 segment. No evidence of branch occlusion, beading, or untreated aneurysm Posterior circulation: The vertebral and basilar arteries are smoothly contoured and widely patent. No branch occlusion, beading, or aneurysm. Venous sinuses: Diffusely opacified. There is asymmetric enhancement in the left cavernous sinus. No secondary signs of carotid cavernous fistula or acute venous thrombosis on the right, presumably chronic and possibly from prior perianeurysmal inflammation. Anatomic variants: As above Review of the MIP images confirms the above findings IMPRESSION: 1.  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 The stent is patent; detection of in stent stenosis/embolic source is limited due to unavoidable artifact. No stenosis or embolic source seen in the more proximal arterial circulation. 2. Asymmetric lack of enhancement at the right cavernous sinus presumably chronic in this clinical setting. 3.  Emphysema (ICD10-J43.9). Electronically Signed   By: Tiburcio Pea M.D.   On: 02/02/2022 12:11   MR BRAIN WO CONTRAST  Result Date: 02/02/2022 CLINICAL DATA:  Left-sided weakness since last night EXAM: MRI HEAD WITHOUT CONTRAST TECHNIQUE: Multiplanar, multiecho pulse sequences of the brain and surrounding structures were obtained without intravenous contrast. COMPARISON:  None Available. FINDINGS: Brain: Small acute cortical infarcts scattered along the right superior frontal, superior parietal, and upper occipital cortex, right MCA periphery/watershed distribution. Mild petechial hemorrhage associated with some of the infarcts. No hematoma, hydrocephalus, collection, or masslike finding. Vascular: Major flow voids are preserved Skull and upper cervical spine: No focal marrow lesion. Sinuses/Orbits: Negative IMPRESSION: Scattered acute infarcts along the right cerebral convexity, MCA watershed distribution. Electronically Signed   By: Audry Riles.D.  On: 02/02/2022 10:28     Levin ErpJagadish, Jaanvi Fizer, MD 02/02/2022, 4:15 PM PGY-1, [facility] Family Medicine FPTS Intern pager: [phone], text pages welcome

## 2022-02-02 NOTE — ED Notes (Signed)
Lt leg weakness that started around 2300 last night with intermittent lt arm weakness. Pt states that his leg is still tingling. Pt does c/o a headache at this time. Provider made aware

## 2022-02-02 NOTE — Discharge Instructions (Addendum)
Dear Baruch Goldmann,   Thank you so much for allowing [place] to be part of your care!  You were admitted to [facility] for a stroke that affected your left foot. You were monitored and should continue Plavix for 3 weeks and aspirin daily lifelong.   POST-HOSPITAL & CARE INSTRUCTIONS Follow up with neurology Please let PCP/Specialists know of any changes that were made.  Please see medications section of this packet for any medication changes.   DOCTOR'S APPOINTMENT & FOLLOW UP CARE INSTRUCTIONS  No future appointments.   Take care and be well!  Family Medicine Teaching Service  [place]  [facility]  [address] [place], [place] [address] [phone]

## 2022-02-02 NOTE — Progress Notes (Signed)
Pt arrived to the floor around 1630 by RN via hospital bed. Pt alert and oriented x4 in no acute distress. VSS. Respirations even and unlabored on room air. Pt oriented to room. Pt instructed on use of call bell. Call bell within reach. Bed in low position.

## 2022-02-02 NOTE — ED Notes (Signed)
Pt was able to walk in the hallway to the bathroom with no assistance. Pt did walk with a limp. Pt stated that he did not feel any increased pain while walking. Pt walked back to him room and he was put back on the monitor.

## 2022-02-02 NOTE — Consult Note (Signed)
Neurology stroke Consult H&P  Carl Armstrong MR# [id number] 02/02/2022   CC: acute stroke  History is obtained from: patient and chart.  HPI: Carl Armstrong is a 51 y.o. male PMHx as reviewed below remote history of transient left arm and leg weakness which first occurred in 2017 and found to have 2 aneurysms which were repaired with symptoms resolution. He remained symptom free until January 2023 where he felt the same symptoms which also completely resolved and presumed to have had TIA. Yesterday ~2330 he again developed left arm and leg weakness which was more significant than before and seemed to have mild improvement however due to never having had the degree/persistence of weakness, he presented to ED for further evaluation.   LKW: 2330 02/01/2022 tNK given: No OSW IR Thrombectomy No, not indicated Modified Rankin Scale: 0-Completely asymptomatic and back to baseline post- stroke NIHSS: 0  ROS: A complete ROS was performed and is negative except as noted in the HPI.  Past Medical History:  Diagnosis Date   Brain aneurysm 2017   Hypertension      History reviewed. No pertinent family history.  Social History:  reports that he has been smoking cigarettes. He has never used smokeless tobacco. He reports that he does not currently use alcohol. He reports that he does not currently use drugs.   Prior to Admission medications   Medication Sig Start Date End Date Taking? Authorizing Provider  aspirin EC 81 MG tablet Take 81 mg by mouth daily. Swallow whole.   Yes [provider]  losartan (COZAAR) 25 MG tablet Take 25 mg by mouth daily.   Yes [provider]  mirtazapine (REMERON) 15 MG tablet Take 15 mg by mouth at bedtime.   Yes [provider]    Exam: Current vital signs: BP 103/73   Pulse 66   Temp 98.3 F (36.8 C) (Oral)   Resp (!) 23   Ht 5\' 5"  (1.651 m)   Wt 74.8 kg   SpO2 98%   BMI 27.46 kg/m   Physical Exam  Constitutional: Appears  well-developed and well-nourished.  Psych: Affect appropriate to situation Eyes: No scleral injection HENT: No OP obstruction. Head: Normocephalic.  Cardiovascular: Normal rate and regular rhythm.  Respiratory: Effort normal, symmetric excursions bilaterally, no audible wheezing. GI: Soft.  No distension. There is no tenderness.  Skin: WDI  Neuro: Mental Status: Patient is awake, alert, oriented to person, place, month, year, and situation. Patient is able to give a clear and coherent history. Speech  fluent, intact comprehension and repetition. No signs of aphasia or neglect. Visual Fields are full. Pupils are equal, round, and reactive to light. EOMI without ptosis or diplopia.  Facial sensation is symmetric to temperature Facial movement is symmetric.  Hearing is intact to voice. Uvula midline and palate elevates symmetrically. Shoulder shrug is symmetric. Tongue is midline without atrophy or fasciculations.  Tone is normal. Bulk is normal. 4+/5 strength was present left upper >left lower extremities. Sensation is symmetric to light touch and temperature in the arms and legs. Deep Tendon Reflexes: 2+ and symmetric in the biceps and patellae. Toes are downgoing bilaterally. FNF and HKS are intact bilaterally. Gait - Deferred  I have reviewed labs in epic and the pertinent results are: Na 139  I have reviewed the images obtained: MRI brain showed scattered acute infarcts along the right cerebral convexity, MCA watershed distribution.   CTA head and neck showed Stent assisted right intracranial ICA aneurysm coiling. The stent is  patent; detection of in stent stenosis/embolic source is limited due to unavoidable artifact. No stenosis or embolic source seen in the more proximal arterial circulation. Asymmetric lack of enhancement at the right cavernous sinus presumably chronic in this clinical setting.   Assessment: Carl Armstrong is a 51 y.o. male PMHx HTN, aneurism repair on ASA  81mg  with acute scattered infarctions in right MCA convexity watershed territory. He arrived outside the window for tNK and there was no indication for thrombectomy and he will need admission for further stroke workup. He states that his most recent labs were normal range except his PCP told him he was prediabetic. He may need further imaging to determine patency of stented aneurism as embolic strokes are known to affect watershed areas as well.  Impression:  Acute right MCA convexity embolic strokes in watershed distribution. Mild left extremity weakness. NIHSS 0 On long term aspirin Remote aneurysm repair TIA HTN Prediabetic  Plan: - Recommend TTE. - Recommend labs: HbA1c, lipid panel. - Recommend Statin for goal LDL <70. - Goal A1c <7. - Continue aspirin 81mg  daily. - Clopidogrel 75mg  daily for 3 weeks. - SBP goal <160. - Telemetry monitoring for arrhythmia. - Recommend bedside Swallow screen. - Recommend Stroke education. - Recommend PT/OT/SLP consult.   This patient is critically ill and at significant risk of neurological worsening, death and care requires constant monitoring of vital signs, hemodynamics,respiratory and cardiac monitoring, neurological assessment, discussion with family, other specialists and medical decision making of high complexity. I spent 75 minutes of neurocritical care time  in the care of  this patient. This was time spent independent of any time provided by nurse practitioner or PA.  Electronically signed by:  , MD Page: 02/02/2022, 1:04 PM  If 7pm- 7am, please page neurology on call as listed in AMION.

## 2022-02-02 NOTE — Hospital Course (Addendum)
Carl Armstrong is a 51 y.o. male presenting with acute stroke . 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 CT angio head and neck showed stent in right intracranial ICA aneurysm with coiling and patent stent in place. Echo showed EF 60-65% and mild MV regurgitation. TSH was WNL at 1.286, lipid panel showed elevated LDL to 123, A1c was elevated in prediabetic range at 6/1%. PT saw patient and recommended outpatient PT. Neurology saw patient and recommended plavix for 3 weeks and aspirin 81 mg daily. Initiated atorvastatin 40 mg. Four vessel cerebral arteriogram showed right ICA suspicious for a web. Neurology recommended additional testing to understand stroke origin. TCD bubble study which is negative for PFO. TEE ordered and will be done outpatient and hypercoagulable panel ordered by neurology and pending.  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

## 2022-02-02 NOTE — ED Notes (Signed)
Pt refusing 12 lead; pt refusing IV and xray; EDP notified

## 2022-02-02 NOTE — Plan of Care (Signed)
  Problem: Education: Goal: Knowledge of General Education information will improve Description: Including pain rating scale, medication(s)/side effects and non-pharmacologic comfort measures Outcome: Progressing   Problem: Nutrition: Goal: Adequate nutrition will be maintained Outcome: Progressing   Problem: Education: Goal: Knowledge of disease or condition will improve Outcome: Progressing   Problem: Coping: Goal: Will identify appropriate support needs Outcome: Progressing   

## 2022-02-02 NOTE — ED Triage Notes (Addendum)
Pt BIB [facility] for headache and numbness to left leg since 2300 last night; manual BP by 172/138; triage BP 114/90; hx of HTN and aneurisms; pt had 4mg  Zofran en route by EMS

## 2022-02-03 ENCOUNTER — Observation Stay (HOSPITAL_BASED_OUTPATIENT_CLINIC_OR_DEPARTMENT_OTHER)

## 2022-02-03 DIAGNOSIS — R7303 Prediabetes: Secondary | ICD-10-CM

## 2022-02-03 DIAGNOSIS — I671 Cerebral aneurysm, nonruptured: Secondary | ICD-10-CM

## 2022-02-03 DIAGNOSIS — I63231 Cerebral infarction due to unspecified occlusion or stenosis of right carotid arteries: Secondary | ICD-10-CM

## 2022-02-03 DIAGNOSIS — G459 Transient cerebral ischemic attack, unspecified: Secondary | ICD-10-CM

## 2022-02-03 DIAGNOSIS — I6389 Other cerebral infarction: Secondary | ICD-10-CM

## 2022-02-03 DIAGNOSIS — I10 Essential (primary) hypertension: Secondary | ICD-10-CM

## 2022-02-03 LAB — HEMOGLOBIN A1C
Hgb A1c MFr Bld: 6.1 % — ABNORMAL HIGH (ref 4.8–5.6)
Mean Plasma Glucose: 128.37 mg/dL

## 2022-02-03 LAB — LIPID PANEL
Cholesterol: 193 mg/dL (ref 0–200)
HDL: 46 mg/dL (ref >40)
LDL Cholesterol: 123 mg/dL — ABNORMAL HIGH (ref 0–99)
Total CHOL/HDL Ratio: 4.2 RATIO
Triglycerides: 120 mg/dL (ref <150)
VLDL: 24 mg/dL (ref 0–40)

## 2022-02-03 LAB — ECHOCARDIOGRAM COMPLETE
AR max vel: 3.43 cm2
AV Area VTI: 3.14 cm2
AV Area mean vel: 3.37 cm2
AV Mean grad: 2 mmHg
AV Peak grad: 4.2 mmHg
Ao pk vel: 1.03 m/s
Area-P 1/2: 3.12 cm2
Height: 65 in
S' Lateral: 2.8 cm
Weight: 2479.73 oz

## 2022-02-03 LAB — BASIC METABOLIC PANEL
Anion gap: 9 (ref 5–15)
BUN: 14 mg/dL (ref 6–20)
CO2: 26 mmol/L (ref 22–32)
Calcium: 9.2 mg/dL (ref 8.9–10.3)
Chloride: 103 mmol/L (ref 98–111)
Creatinine, Ser: 1.37 mg/dL — ABNORMAL HIGH (ref 0.61–1.24)
GFR, Estimated: >60 mL/min (ref >60)
Glucose, Bld: 122 mg/dL — ABNORMAL HIGH (ref 70–99)
Potassium: 3.1 mmol/L — ABNORMAL LOW (ref 3.5–5.1)
Sodium: 138 mmol/L (ref 135–145)

## 2022-02-03 LAB — CBC
HCT: 41.9 % (ref 39.0–52.0)
Hemoglobin: 13.3 g/dL (ref 13.0–17.0)
MCH: 23.4 pg — ABNORMAL LOW (ref 26.0–34.0)
MCHC: 31.7 g/dL (ref 30.0–36.0)
MCV: 73.8 fL — ABNORMAL LOW (ref 80.0–100.0)
Platelets: 397 10*3/uL (ref 150–400)
RBC: 5.68 MIL/uL (ref 4.22–5.81)
RDW: 14.6 % (ref 11.5–15.5)
WBC: 10.3 10*3/uL (ref 4.0–10.5)
nRBC: 0 % (ref 0.0–0.2)

## 2022-02-03 LAB — RAPID URINE DRUG SCREEN, HOSP PERFORMED
Amphetamines: NOT DETECTED
Barbiturates: NOT DETECTED
Benzodiazepines: NOT DETECTED
Cocaine: NOT DETECTED
Opiates: NOT DETECTED
Tetrahydrocannabinol: NOT DETECTED

## 2022-02-03 LAB — HIV ANTIBODY (ROUTINE TESTING W REFLEX): HIV Screen 4th Generation wRfx: NONREACTIVE

## 2022-02-03 LAB — TSH: TSH: 1.286 u[IU]/mL (ref 0.350–4.500)

## 2022-02-03 MED ORDER — POTASSIUM CHLORIDE 20 MEQ PO PACK
40.0000 meq | PACK | Freq: Two times a day (BID) | ORAL | Status: DC
  Filled 2022-02-03: qty 2

## 2022-02-03 MED ORDER — TRAMADOL HCL 50 MG PO TABS
50.0000 mg | ORAL_TABLET | Freq: Once | ORAL | Status: AC
  Administered 2022-02-03: 50 mg via ORAL
  Filled 2022-02-03: qty 1

## 2022-02-03 MED ORDER — POTASSIUM CHLORIDE CRYS ER 20 MEQ PO TBCR
40.0000 meq | EXTENDED_RELEASE_TABLET | Freq: Two times a day (BID) | ORAL | Status: AC
  Administered 2022-02-03 (×2): 40 meq via ORAL
  Filled 2022-02-03 (×2): qty 2

## 2022-02-03 MED ORDER — SODIUM CHLORIDE 0.9 % IV BOLUS
500.0000 mL | Freq: Once | INTRAVENOUS | Status: AC
  Administered 2022-02-03: 500 mL via INTRAVENOUS

## 2022-02-03 MED ORDER — ATORVASTATIN CALCIUM 40 MG PO TABS
40.0000 mg | ORAL_TABLET | Freq: Every day | ORAL | Status: DC
  Administered 2022-02-03 – 2022-02-05 (×3): 40 mg via ORAL
  Filled 2022-02-03 (×3): qty 1

## 2022-02-03 MED ORDER — HYDROXYZINE HCL 25 MG PO TABS: 50.0000 mg | ORAL_TABLET | Freq: Three times a day (TID) | ORAL | Status: DC | PRN

## 2022-02-03 NOTE — Plan of Care (Signed)
  Problem: Nutrition: Goal: Adequate nutrition will be maintained Outcome: Progressing   Problem: Pain Managment: Goal: General experience of comfort will improve Outcome: Progressing   Problem: Education: Goal: Knowledge of disease or condition will improve Outcome: Progressing   

## 2022-02-03 NOTE — Progress Notes (Signed)
FPTS Brief Progress Note  S: patient resting- he was very anxious earlier in the night worrying about his stroke and reported a headache that was only somewhat responsive to tylenol and motrin. He has not reported pain since then.   O: BP 110/82 (BP Location: Right Arm)   Pulse 83   Temp 98 F (36.7 C) (Oral)   Resp 20   Ht 5\' 5"  (1.651 m)   Wt 70.3 kg   SpO2 96%   BMI 25.79 kg/m     A/P: - Orders reviewed. Labs for AM ordered, which was adjusted as needed.   , MD 02/03/2022, 2:13 AM PGY-3, [facility] Family Medicine Night Resident  Please page [phone] with questions.

## 2022-02-03 NOTE — Progress Notes (Signed)
Physical Therapy Treatment Patient Details Name: Carl Armstrong MRN: [id number] DOB: Jul 14, 1971 Today's Date: 02/03/2022   History of Present Illness 51 y.o. male presenting with acute onset L leg weakness. MRI demonstrates scattered infarcts in R MCA territory. 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 Patient prefers single crutch and ambulated safely with this. Cued pt to try to strike heel first when advancing LLE, with slight improvement initially, however fatigued quickly and returned to foot flat landing. May yet need an AFO, however would wait until seen in OPPT to make final determination.    Recommendations for follow up therapy are one component of a multi-disciplinary discharge planning process, led by the attending physician.  Recommendations may be updated based on patient status, additional functional criteria and insurance authorization.  Follow Up Recommendations  Outpatient PT     Assistance Recommended at Discharge PRN  Patient can return home with the following Help with stairs or ramp for entrance   Equipment Recommendations  Crutches (pt will use single crutch)    Recommendations for Other Services OT consult     Precautions / Restrictions Precautions Precautions: Fall Restrictions Weight Bearing Restrictions: No     Mobility  Bed Mobility Overal bed mobility: Independent                  Transfers Overall transfer level: Modified independent Equipment used: Crutches               General transfer comment: no cues needed    Ambulation/Gait Ambulation/Gait assistance: Min guard, Modified independent (Device/Increase time) Gait Distance (Feet): 250 Feet Assistive device:  (single crutch in RUE) Gait Pattern/deviations: Step-through pattern, Decreased step length - left, Decreased dorsiflexion - left, Knee  hyperextension - left Gait velocity: appropriately decr Gait velocity interpretation: >2.62 ft/sec, indicative of community ambulatory   General Gait Details: pt reports feeling more secure with single crutch than with cane; pt with appropriate sequencing after min cues; cued for heelstrike with pt continuing to land foot flat. Discussed may need AFO, however too soon to decide on that and want to give brain/muscles time to improve.   Stairs             Wheelchair Mobility    Modified Rankin (Stroke Patients Only) Modified Rankin (Stroke Patients Only) Pre-Morbid Rankin Score: No symptoms Modified Rankin: Moderate disability     Balance Overall balance assessment: Mild deficits observed, not formally tested                                          Cognition Arousal/Alertness: Awake/alert Behavior During Therapy: WFL for tasks assessed/performed Overall Cognitive Status: Within Functional Limits for tasks assessed                                          Exercises Other Exercises Other Exercises: educated to do bil ankle DF throughout the day    General Comments        Pertinent Vitals/Pain Pain Assessment Pain Assessment: No/denies pain    Home Living  Prior Function            PT Goals (current goals can now be found in the care plan section) Acute Rehab PT Goals Patient Stated Goal: get strength back Time For Goal Achievement: 02/17/22 Potential to Achieve Goals: Good Progress towards PT goals: Progressing toward goals    Frequency    Min 4X/week      PT Plan Current plan remains appropriate    Co-evaluation              AM-PAC PT "6 Clicks" Mobility   Outcome Measure  Help needed turning from your back to your side while in a flat bed without using bedrails?: None Help needed moving from lying on your back to sitting on the side of a flat bed without using  bedrails?: None Help needed moving to and from a bed to a chair (including a wheelchair)?: None Help needed standing up from a chair using your arms (e.g., wheelchair or bedside chair)?: None Help needed to walk in hospital room?: None Help needed climbing 3-5 steps with a railing? : A Little 6 Click Score: 23    End of Session   Activity Tolerance: Patient tolerated treatment well Patient left: in bed;with call bell/phone within reach;with family/visitor present Nurse Communication: Mobility status;Other (comment) (need for crutches) PT Visit Diagnosis: Other abnormalities of gait and mobility (R26.89)     Time: 2703-5009 PT Time Calculation (min) (ACUTE ONLY): 9 min  Charges:  $Gait Training: 8-22 mins                      Jerolyn Center, PT Acute Rehabilitation Services  Pager [phone] Office [phone]    Zena Amos 02/03/2022, 2:59 PM

## 2022-02-03 NOTE — Evaluation (Signed)
Physical Therapy Evaluation Patient Details Name: Carl Armstrong MRN: [id number] DOB: December 16, 1970 Today's Date: 02/03/2022  History of Present Illness  51 y.o. male presenting with acute onset L leg weakness. MRI demonstrates scattered infarcts in R MCA territory. 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 PTA patient was independent with all mobility. Pt currently requires minguard progressing to modified independent with use of cane. Patient wanting to attempt use of crutch, but on return with equipment pt having room cleaned and then with echo. OT aware crutch is in room and they may attempt during their evaluation vs PT able to return. Once equipment finalized, pt OK to discharge from PT perspective. Anticipate patient will benefit from PT to address problems listed below.Will continue to follow acutely to maximize functional mobility independence and safety.          Recommendations for follow up therapy are one component of a multi-disciplinary discharge planning process, led by the attending physician.  Recommendations may be updated based on patient status, additional functional criteria and insurance authorization.  Follow Up Recommendations Outpatient PT    Assistance Recommended at Discharge PRN  Patient can return home with the following  Help with stairs or ramp for entrance    Equipment Recommendations [place] Mor (discussed pt could purchase vs have insurance pay (as they would not later pay for walker, etc))  Recommendations for Other Services  OT consult    Functional Status Assessment Patient has had a recent decline in their functional status and demonstrates the ability to make significant improvements in function in a reasonable and predictable amount of time.     Precautions / Restrictions Precautions Precautions: Fall      Mobility  Bed Mobility Overal bed  mobility: Independent                  Transfers Overall transfer level: Modified independent Equipment used: Rolling walker (2 wheels), Straight cane               General transfer comment: no cues needed    Ambulation/Gait Ambulation/Gait assistance: Min guard, Modified independent (Device/Increase time) Gait Distance (Feet): 120 Feet (RW; 100 cane) Assistive device: Rolling walker (2 wheels), Straight cane Gait Pattern/deviations: Step-through pattern, Decreased step length - left, Decreased dorsiflexion - left, Knee hyperextension - left Gait velocity: appropriately decr Gait velocity interpretation: >2.62 ft/sec, indicative of community ambulatory   General Gait Details: pt instinctively performs steppage gait; attempted RW with very light UE support and pt felt too unsteady to go without a device; switched to cane and pt did well but asked about using a single crutch. Will return with crutch and practice (or ask OT to practice with crutch vs cane)  Stairs            Wheelchair Mobility    Modified Rankin (Stroke Patients Only) Modified Rankin (Stroke Patients Only) Pre-Morbid Rankin Score: No symptoms Modified Rankin: Moderate disability     Balance Overall balance assessment: Mild deficits observed, not formally tested                                           Pertinent Vitals/Pain Pain Assessment Pain Assessment: No/denies pain    Home Living Family/patient expects to be discharged to:: Private residence Living Arrangements: Spouse/significant other  Home Access: Stairs to enter Entrance Stairs-Rails: Right Entrance Stairs-Number of Steps: 2   Home Layout: One level Home Equipment: None      Prior Function Prior Level of Function : Independent/Modified Independent             Mobility Comments: not currently working       Higher education careers adviser        Extremity/Trunk Assessment   Upper Extremity  Assessment Upper Extremity Assessment: Defer to OT evaluation    Lower Extremity Assessment Lower Extremity Assessment: LLE deficits/detail (RLE 5/5 throughout) LLE Deficits / Details: 0/5 ankle DF; 4/5 PF; knee flexion 3+, knee extension 5/5 LLE Sensation: decreased light touch (in foot only)    Cervical / Trunk Assessment Cervical / Trunk Assessment: Normal  Communication   Communication: No difficulties  Cognition Arousal/Alertness: Awake/alert Behavior During Therapy: WFL for tasks assessed/performed Overall Cognitive Status: Within Functional Limits for tasks assessed                                          General Comments      Exercises Other Exercises Other Exercises: educated to do bil ankle DF throughout the day   Assessment/Plan    PT Assessment Patient needs continued PT services  PT Problem List Decreased strength;Decreased balance;Decreased mobility;Decreased knowledge of use of DME;Impaired sensation       PT Treatment Interventions DME instruction;Gait training;Stair training;Functional mobility training;Therapeutic activities;Balance training;Neuromuscular re-education;Patient/family education    PT Goals (Current goals can be found in the Care Plan section)  Acute Rehab PT Goals Patient Stated Goal: get strength back PT Goal Formulation: With patient Time For Goal Achievement: 02/17/22 Potential to Achieve Goals: Good    Frequency Min 4X/week     Co-evaluation               AM-PAC PT "6 Clicks" Mobility  Outcome Measure Help needed turning from your back to your side while in a flat bed without using bedrails?: None Help needed moving from lying on your back to sitting on the side of a flat bed without using bedrails?: None Help needed moving to and from a bed to a chair (including a wheelchair)?: None Help needed standing up from a chair using your arms (e.g., wheelchair or bedside chair)?: None Help needed to walk in  hospital room?: None Help needed climbing 3-5 steps with a railing? : A Little 6 Click Score: 23    End of Session Equipment Utilized During Treatment: Gait belt Activity Tolerance: Patient tolerated treatment well Patient left: in bed;with call bell/phone within reach;with family/visitor present Nurse Communication: Mobility status PT Visit Diagnosis: Other abnormalities of gait and mobility (R26.89)    Time: 4503-8882 PT Time Calculation (min) (ACUTE ONLY): 24 min   Charges:   PT Evaluation $PT Eval Low Complexity: 1 Low PT Treatments $Gait Training: 8-22 mins         Jerolyn Center, PT Acute Rehabilitation Services  Pager [phone] Office [phone]   Zena Amos 02/03/2022, 11:07 AM

## 2022-02-03 NOTE — Progress Notes (Addendum)
Family Medicine Teaching Service Daily Progress Note Intern Pager: [phone]  Patient name: Carl Armstrong Medical record number: [id number] Date of birth: 12/30/1970 Age: 51 y.o. Gender: male  Primary Care Provider: Patient, No Pcp Per (Inactive) Consultants: Neurology Code Status: Full code  Pt Overview and Major Events to Date:  5/27 admitted for stroke work-up  Assessment and Plan: Carl Armstrong is a 51 year old male presenting with acute stroke.  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  Stroke  Acute infarct of R cerebral convexity, MCA watershed MRI brain showed acute infarcts along the right cerebral convexity in the MCA watershed distribution. At admission, patient had left foot drop unable to dorsiflex or plantarflex and some diminished sensation in the left lower leg.  Today his exam remains unchanged.  See below for risk stratification recommendations.  TSH within normal limits. - Neurology consulted, appreciate recommendations - Aspirin 81 mg daily - Plavix 75 mg daily for 3 weeks - Echo - Continue cardiac monitoring - PT/OT eval and treat  Lab abnormalities  Hypokalemia Very mild hypokalemia to 3.1.  Repleted.  Mild elevation in creatinine to 1.37 from 1.14.  Not quite an AKI.  We will give bolus and continue to monitor to her -AM BMP -NS 500 cc bolus  History of SAH in 2017  8 ICA aneurysm x2 status post coiling subsequent PED placement Patient had mild headache treated with Tylenol and ibuprofen, he reports that headache decreased to 2-3/10 from initial 8-10. - Neurology following, appreciate recommendations -Low threshold for imaging and patency of stented aneurysm if severe headache  Hypertension Home medication is losartan 25 daily and HCTZ.  Patient had hypotensive pressure yesterday at admission, 90s over 50s.  It has improved to 100s to 110s over 70s to 80s. -Continue to hold  home medications and monitor blood pressure  Hyperlipidemia Total cholesterol 193, HDL 46, LDL high at 123.  Is not on a statin.  Recommend initiating high intensity statin such as atorvastatin 40 mg. -Start atorvastatin 40 mg  Prediabetes A1c is risen from 5.7 in 2021 to 6.1% today.  Recommend dietary and lifestyle changes, discussed this with patient. - Heart healthy diet  Tobacco use Continue to encourage cessation for risks factor modification. - Consider nicotine patch  History of alcohol withdrawal Patient has a history of alcohol withdrawal 2021.  He states that he has not had any alcohol since 2 years ago.  He did have CIWA of 10 9 last night, but upon discussion with the other resident, this was due to anxiety about his diagnosis of repeat stroke.  No objective findings for alcohol withdrawal. - Continue CIWA without Ativan  FEN/GI: Heart healthy PPx: Diet Dispo:Pending PT recommendations   in 1-2 days . Barriers include completion of medical work up.   Subjective:  Patient was quite anxious overnight as he is concerned about this repeat stroke.  He reports that he knows he has prediabetes and he has tried to do increased exercise and change his diet.  He reports that he had an 8 out of 10 headache last night but after receiving Tylenol and Motrin his headache decreased to 2 out of 3 out of 10 now.  He had had difficulties sleeping due to worrying about a stroke.  Objective: Temp:  [98 F (36.7 C)-100.1 F (37.8 C)] 98 F (36.7 C) (05/27 2331) Pulse Rate:  [66-85] 83 (05/27 2331) Resp:  [13-29] 20 (05/28 0102) BP: (91-139)/(53-84)  110/82 (05/27 2331) SpO2:  [95 %-100 %] 96 % (05/27 2331) Weight:  [70.3 kg] 70.3 kg (05/27 1641) Physical Exam: General: Age-appropriate, AAM, no acute distress, laying comfortably in bed, WN WD Cardiovascular: RRR, no M/R/G, 2+ radial pulses Respiratory: CTAB, no increased work of breathing Abdomen: Soft, NT ND, normoactive bowel  sounds Extremities: No lower extremity edema Neuro: Cranial Nerves II: PERRL.  III,IV, VI: EOMI without ptosis or diplopia.  V: Facial sensation is symmetric to touch VII: Facial movement is symmetric.  VIII: Hearing is intact to voice X: Palate elevates symmetrically XI: Shoulder shrug is symmetric. XII: Tongue is midline without atrophy or fasciculations.  Motor: Tone is normal. Bulk is normal. 5/5 strength was present in b/l upper extremities, RLE 5/5, LLE with foot drop 0/5 and 3/5 above foot Sensory: Sensation is symmetric to light touch in the arms, diminished sensation on LLE  Laboratory: Recent Labs  Lab 02/02/22 0552 02/03/22 0055  WBC 11.4* 10.3  HGB 13.0 13.3  HCT 40.3 41.9  PLT 357 397   Recent Labs  Lab 02/02/22 0552 02/03/22 0055  NA 139 138  K 3.7 3.1*  CL 103 103  CO2 28 26  BUN 8 14  CREATININE 1.14 1.37*  CALCIUM 8.6* 9.2  GLUCOSE 101* 122*   Imaging/Diagnostic Tests: CT ANGIO HEAD NECK W WO CM  Result Date: 02/02/2022 CLINICAL DATA:  Stroke follow-up EXAM: CT ANGIOGRAPHY HEAD AND NECK TECHNIQUE: Multidetector CT imaging of the head and neck was performed using the standard protocol during bolus administration of intravenous contrast. Multiplanar CT image reconstructions and MIPs were obtained to evaluate the vascular anatomy. Carotid stenosis measurements (when applicable) are obtained utilizing NASCET criteria, using the distal internal carotid diameter as the denominator. RADIATION DOSE REDUCTION: This exam was performed according to the departmental dose-optimization program which includes automated exposure control, adjustment of the mA and/or kV according to patient size and/or use of iterative reconstruction technique. CONTRAST:  10mL OMNIPAQUE IOHEXOL 350 MG/ML SOLN COMPARISON:  Preceding brain MRI FINDINGS: CT HEAD FINDINGS Brain: Acute cortical infarcts along the right cerebral convexity, underestimated relative to prior MRI. No acute hemorrhage,  hydrocephalus, or masslike finding. Vascular: See below Skull: Negative Sinuses: Clear Orbits: Negative Review of the MIP images confirms the above findings CTA NECK FINDINGS Aortic arch: Limited coverage is negative.  Three vessel branching. Right carotid system: Vessels are smooth and diffusely patent without atheromatous change. Left carotid system: Vessels are smooth and diffusely patent without atheromatous change. Vertebral arteries: No proximal subclavian stenosis. Both vertebral arteries are smoothly contoured and widely patent to the dura. Skeleton: No acute finding. Other neck: No evidence of mass or inflammation Upper chest: Biapical centrilobular emphysema. Review of the MIP images confirms the above findings CTA HEAD FINDINGS Anterior circulation: Stent assisted right ICA aneurysm coiling with 2 coil masses. There is unavoidable artifact at the level of the stent and coil masses. Visible flow within the stent and no downstream diminished flow. Dominant right A1 segment. No evidence of branch occlusion, beading, or untreated aneurysm Posterior circulation: The vertebral and basilar arteries are smoothly contoured and widely patent. No branch occlusion, beading, or aneurysm. Venous sinuses: Diffusely opacified. There is asymmetric enhancement in the left cavernous sinus. No secondary signs of carotid cavernous fistula or acute venous thrombosis on the right, presumably chronic and possibly from prior perianeurysmal inflammation. Anatomic variants: As above Review of the MIP images confirms the above findings IMPRESSION: 1. 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 The stent  is patent; detection of in stent stenosis/embolic source is limited due to unavoidable artifact. No stenosis or embolic source seen in the more proximal arterial circulation. 2. Asymmetric lack of enhancement at the right cavernous sinus presumably chronic in this clinical setting. 3.  Emphysema (ICD10-J43.9).  Electronically Signed   By: Tiburcio Pea M.D.   On: 02/02/2022 12:11   MR BRAIN WO CONTRAST  Result Date: 02/02/2022 CLINICAL DATA:  Left-sided weakness since last night EXAM: MRI HEAD WITHOUT CONTRAST TECHNIQUE: Multiplanar, multiecho pulse sequences of the brain and surrounding structures were obtained without intravenous contrast. COMPARISON:  None Available. FINDINGS: Brain: Small acute cortical infarcts scattered along the right superior frontal, superior parietal, and upper occipital cortex, right MCA periphery/watershed distribution. Mild petechial hemorrhage associated with some of the infarcts. No hematoma, hydrocephalus, collection, or masslike finding. Vascular: Major flow voids are preserved Skull and upper cervical spine: No focal marrow lesion. Sinuses/Orbits: Negative IMPRESSION: Scattered acute infarcts along the right cerebral convexity, MCA watershed distribution. Electronically Signed   By: Tiburcio Pea M.D.   On: 02/02/2022 10:28    Shirlean Mylar, MD 02/03/2022, 2:48 AM PGY-3, [facility] Family Medicine FPTS Intern pager: [phone], text pages welcome

## 2022-02-03 NOTE — Progress Notes (Signed)
STROKE TEAM PROGRESS NOTE   SUBJECTIVE (INTERVAL HISTORY) His wife is at the bedside.  Overall his condition is stable. Still has left foot drop and left leg numbness. Stated that he had similar episode last year and in 09/2021 but lasted short period time and resolved. In [place]21 [place] but stroke work up negative.    OBJECTIVE Temp:  [98 F (36.7 C)-100.1 F (37.8 C)] 98.5 F (36.9 C) (05/28 1239) Pulse Rate:  [63-83] 69 (05/28 1239) Cardiac Rhythm: Normal sinus rhythm (05/28 0900) Resp:  [13-29] 20 (05/28 1239) BP: (95-127)/(58-82) 127/82 (05/28 1239) SpO2:  [92 %-100 %] 96 % (05/28 1239) Weight:  [70.3 kg] 70.3 kg (05/27 1641)  No results for input(s): GLUCAP in the last 168 hours. Recent Labs  Lab 02/02/22 0552 02/03/22 0055  NA 139 138  K 3.7 3.1*  CL 103 103  CO2 28 26  GLUCOSE 101* 122*  BUN 8 14  CREATININE 1.14 1.37*  CALCIUM 8.6* 9.2   No results for input(s): AST, ALT, ALKPHOS, BILITOT, PROT, ALBUMIN in the last 168 hours. Recent Labs  Lab 02/02/22 0552 02/03/22 0055  WBC 11.4* 10.3  NEUTROABS 8.7*  --   HGB 13.0 13.3  HCT 40.3 41.9  MCV 73.9* 73.8*  PLT 357 397   No results for input(s): CKTOTAL, CKMB, CKMBINDEX, TROPONINI in the last 168 hours. No results for input(s): LABPROT, INR in the last 72 hours. No results for input(s): COLORURINE, LABSPEC, PHURINE, GLUCOSEU, HGBUR, BILIRUBINUR, KETONESUR, PROTEINUR, UROBILINOGEN, NITRITE, LEUKOCYTESUR in the last 72 hours.  Invalid input(s): APPERANCEUR     Component Value Date/Time   CHOL 193 02/03/2022 0055   TRIG 120 02/03/2022 0055   HDL 46 02/03/2022 0055   CHOLHDL 4.2 02/03/2022 0055   VLDL 24 02/03/2022 0055   LDLCALC 123 (H) 02/03/2022 0055   Lab Results  Component Value Date   HGBA1C 6.1 (H) 02/03/2022   No results found for: LABOPIA, COCAINSCRNUR, LABBENZ, AMPHETMU, THCU, LABBARB  No results for input(s): ETH in the last 168 hours.  I have personally reviewed the  radiological images below and agree with the radiology interpretations.  CT ANGIO HEAD NECK W WO CM  Result Date: 02/02/2022 CLINICAL DATA:  Stroke follow-up EXAM: CT ANGIOGRAPHY HEAD AND NECK TECHNIQUE: Multidetector CT imaging of the head and neck was performed using the standard protocol during bolus administration of intravenous contrast. Multiplanar CT image reconstructions and MIPs were obtained to evaluate the vascular anatomy. Carotid stenosis measurements (when applicable) are obtained utilizing NASCET criteria, using the distal internal carotid diameter as the denominator. RADIATION DOSE REDUCTION: This exam was performed according to the departmental dose-optimization program which includes automated exposure control, adjustment of the mA and/or kV according to patient size and/or use of iterative reconstruction technique. CONTRAST:  75mL OMNIPAQUE IOHEXOL 350 MG/ML SOLN COMPARISON:  Preceding brain MRI FINDINGS: CT HEAD FINDINGS Brain: Acute cortical infarcts along the right cerebral convexity, underestimated relative to prior MRI. No acute hemorrhage, hydrocephalus, or masslike finding. Vascular: See below Skull: Negative Sinuses: Clear Orbits: Negative Review of the MIP images confirms the above findings CTA NECK FINDINGS Aortic arch: Limited coverage is negative.  Three vessel branching. Right carotid system: Vessels are smooth and diffusely patent without atheromatous change. Left carotid system: Vessels are smooth and diffusely patent without atheromatous change. Vertebral arteries: No proximal subclavian stenosis. Both vertebral arteries are smoothly contoured and widely patent to the dura. Skeleton: No acute finding. Other neck: No evidence of  mass or inflammation Upper chest: Biapical centrilobular emphysema. Review of the MIP images confirms the above findings CTA HEAD FINDINGS Anterior circulation: Stent assisted right ICA aneurysm coiling with 2 coil masses. There is unavoidable artifact  at the level of the stent and coil masses. Visible flow within the stent and no downstream diminished flow. Dominant right A1 segment. No evidence of branch occlusion, beading, or untreated aneurysm Posterior circulation: The vertebral and basilar arteries are smoothly contoured and widely patent. No branch occlusion, beading, or aneurysm. Venous sinuses: Diffusely opacified. There is asymmetric enhancement in the left cavernous sinus. No secondary signs of carotid cavernous fistula or acute venous thrombosis on the right, presumably chronic and possibly from prior perianeurysmal inflammation. Anatomic variants: As above Review of the MIP images confirms the above findings IMPRESSION: 1. 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 The stent is patent; detection of in stent stenosis/embolic source is limited due to unavoidable artifact. No stenosis or embolic source seen in the more proximal arterial circulation. 2. Asymmetric lack of enhancement at the right cavernous sinus presumably chronic in this clinical setting. 3.  Emphysema (ICD10-J43.9). Electronically Signed   By: Tiburcio Pea M.D.   On: 02/02/2022 12:11   MR BRAIN WO CONTRAST  Result Date: 02/02/2022 CLINICAL DATA:  Left-sided weakness since last night EXAM: MRI HEAD WITHOUT CONTRAST TECHNIQUE: Multiplanar, multiecho pulse sequences of the brain and surrounding structures were obtained without intravenous contrast. COMPARISON:  None Available. FINDINGS: Brain: Small acute cortical infarcts scattered along the right superior frontal, superior parietal, and upper occipital cortex, right MCA periphery/watershed distribution. Mild petechial hemorrhage associated with some of the infarcts. No hematoma, hydrocephalus, collection, or masslike finding. Vascular: Major flow voids are preserved Skull and upper cervical spine: No focal marrow lesion. Sinuses/Orbits: Negative IMPRESSION: Scattered acute infarcts along the right cerebral convexity,  MCA watershed distribution. Electronically Signed   By: Tiburcio Pea M.D.   On: 02/02/2022 10:28   ECHOCARDIOGRAM COMPLETE  Result Date: 02/03/2022    ECHOCARDIOGRAM REPORT   Patient Name:   Carl Armstrong Date of Exam: 02/03/2022 Medical Rec #:  [id number]    Height:       65.0 in Accession #:    [id number]   Weight:       155.0 lb Date of Birth:  [birth year]    BSA:          1.775 m Patient Age:    50 years     BP:           100/58 mmHg Patient Gender: M            HR:           73 bpm. Exam Location:  Inpatient Procedure: 2D Echo, Cardiac Doppler and Color Doppler Indications:    Stroke  History:        Patient has no prior history of Echocardiogram examinations.                 Risk Factors:Hypertension and Current Smoker.  Sonographer:    Ross Ludwig RDCS (AE) Referring Phys: [id number] Estevan Ryder MCINTYRE IMPRESSIONS  1. Left ventricular ejection fraction, by estimation, is 60 to 65%. The left ventricle has normal function. The left ventricle has no regional wall motion abnormalities. Left ventricular diastolic parameters were normal.  2. Right ventricular systolic function is low normal. The right ventricular size is mildly enlarged. Tricuspid regurgitation signal is inadequate for assessing PA pressure.  3. The mitral valve is abnormal. Mild mitral  valve regurgitation.  4. The aortic valve is tricuspid. Aortic valve regurgitation is not visualized.  5. The inferior vena cava is normal in size with greater than 50% respiratory variability, suggesting right atrial pressure of 3 mmHg. Comparison(s): No prior Echocardiogram. FINDINGS  Left Ventricle: Left ventricular ejection fraction, by estimation, is 60 to 65%. The left ventricle has normal function. The left ventricle has no regional wall motion abnormalities. The left ventricular internal cavity size was normal in size. There is  no left ventricular hypertrophy. Left ventricular diastolic parameters were normal. Right Ventricle: The right ventricular size is  mildly enlarged. No increase in right ventricular wall thickness. Right ventricular systolic function is low normal. Tricuspid regurgitation signal is inadequate for assessing PA pressure. Left Atrium: Left atrial size was normal in size. Right Atrium: Right atrial size was normal in size. Pericardium: There is no evidence of pericardial effusion. Mitral Valve: The mitral valve is abnormal. There is mild thickening of the posterior and anterior mitral valve leaflet(s). Mild mitral valve regurgitation. Tricuspid Valve: The tricuspid valve is grossly normal. Tricuspid valve regurgitation is trivial. Aortic Valve: The aortic valve is tricuspid. Aortic valve regurgitation is not visualized. Aortic valve mean gradient measures 2.0 mmHg. Aortic valve peak gradient measures 4.2 mmHg. Aortic valve area, by VTI measures 3.14 cm. Pulmonic Valve: The pulmonic valve was grossly normal. Pulmonic valve regurgitation is mild. Aorta: The aortic root and ascending aorta are structurally normal, with no evidence of dilitation. Venous: The inferior vena cava is normal in size with greater than 50% respiratory variability, suggesting right atrial pressure of 3 mmHg. IAS/Shunts: No atrial level shunt detected by color flow Doppler.  LEFT VENTRICLE PLAX 2D LVIDd:         4.40 cm   Diastology LVIDs:         2.80 cm   LV e' medial:    9.46 cm/s LV PW:         1.00 cm   LV E/e' medial:  7.4 LV IVS:        1.00 cm   LV e' lateral:   10.90 cm/s LVOT diam:     2.30 cm   LV E/e' lateral: 6.4 LV SV:         69 LV SV Index:   39 LVOT Area:     4.15 cm  RIGHT VENTRICLE            IVC RV Basal diam:  2.70 cm    IVC diam: 1.30 cm RV S prime:     9.25 cm/s TAPSE (M-mode): 2.5 cm LEFT ATRIUM             Index        RIGHT ATRIUM           Index LA diam:        2.70 cm 1.52 cm/m   RA Area:     14.50 cm LA Vol (A2C):   28.7 ml 16.17 ml/m  RA Volume:   33.50 ml  18.87 ml/m LA Vol (A4C):   47.7 ml 26.87 ml/m LA Biplane Vol: 37.9 ml 21.35 ml/m   AORTIC VALVE AV Area (Vmax):    3.43 cm AV Area (Vmean):   3.37 cm AV Area (VTI):     3.14 cm AV Vmax:           103.00 cm/s AV Vmean:          65.400 cm/s AV VTI:  0.218 m AV Peak Grad:      4.2 mmHg AV Mean Grad:      2.0 mmHg LVOT Vmax:         85.00 cm/s LVOT Vmean:        53.000 cm/s LVOT VTI:          0.165 m LVOT/AV VTI ratio: 0.76  AORTA Ao Root diam: 3.20 cm Ao Asc diam:  2.90 cm MITRAL VALVE MV Area (PHT): 3.12 cm    SHUNTS MV Decel Time: 243 msec    Systemic VTI:  0.16 m MV E velocity: 69.70 cm/s  Systemic Diam: 2.30 cm MV A velocity: 43.20 cm/s MV E/A ratio:  1.61 Zoila Shutter MD Electronically signed by Zoila Shutter MD Signature Date/Time: 02/03/2022/12:00:58 PM    Final      PHYSICAL EXAM  Temp:  [98 F (36.7 C)-100.1 F (37.8 C)] 98.5 F (36.9 C) (05/28 1239) Pulse Rate:  [63-83] 69 (05/28 1239) Resp:  [13-29] 20 (05/28 1239) BP: (95-127)/(58-82) 127/82 (05/28 1239) SpO2:  [92 %-100 %] 96 % (05/28 1239) Weight:  [70.3 kg] 70.3 kg (05/27 1641)  General - Well nourished, well developed, in no apparent distress.  Ophthalmologic - fundi not visualized due to noncooperation.  Cardiovascular - Regular rhythm and rate.  Mental Status -  Level of arousal and orientation to time, place, and person were intact. Language including expression, naming, repetition, comprehension was assessed and found intact. Attention span and concentration were normal. Fund of Knowledge was assessed and was intact.  Cranial Nerves II - XII - II - Visual field intact OU. III, IV, VI - Extraocular movements intact. V - Facial sensation intact bilaterally. VII - Facial movement intact bilaterally. VIII - Hearing & vestibular intact bilaterally. X - Palate elevates symmetrically. XI - Chin turning & shoulder shrug intact bilaterally. XII - Tongue protrusion intact.  Motor Strength - The patient's strength was normal in all extremities and pronator drift was absent except left foot  drop with DF 0/5, PF 3/5.  Bulk was normal and fasciculations were absent.   Motor Tone - Muscle tone was assessed at the neck and appendages and was normal.  Reflexes - The patient's reflexes were symmetrical in all extremities and he had no pathological reflexes.  Sensory - Light touch, temperature/pinprick were assessed and were symmetrical except decreased light touch sensation at left LE.    Coordination - The patient had normal movements in the hands and right foot with no ataxia or dysmetria.  However, left LTS ataxic. Tremor was absent.  Gait and Station - deferred.   ASSESSMENT/PLAN Carl Armstrong is a 51 y.o. male with history of hypertension, smoker, alcohol abuse, cerebral aneurysm status post coil embolization and pipeline stent, TIA admitted for left-sided weakness. No tPA given due to outside window.    Stroke:  right MCA and ACA scattered small infarcts, concerning for right ICA and ACA stenting/coil related MRI right ICA and MCA scattered infarcts CT head and neck status post right intracranial ICA aneurysm coiling and pipeline stent, which is patent.  Detection of in-stent stenosis/embolic source is limited due to artifact. Cerebral angiogram pending, plan for 5/30 with Dr. Corliss Skains 2D Echo EF 60 to 65% LDL 123 HgbA1c 6.1 UDS pending Lovenox for VTE prophylaxis aspirin 81 mg daily prior to admission, now on aspirin 81 mg daily and clopidogrel 75 mg daily DAPT. Patient counseled to be compliant with his antithrombotic medications Ongoing aggressive stroke risk factor management Therapy recommendations: Outpatient  PT Disposition: Pending  Cerebral aneurysm 10/2015 postcoital headache, admitted to [facility], found to have diffuse SAH on CT.  CTA head and neck showed right A1 and right terminal ICA aneurysms.  Status post coil embolization. 05/2016 follow-up cerebral angiogram in [facility], developed headache and blurry vision, confusion and aphasia post angiogram.  CT negative,  CTA head and neck negative.  Discharged on aspirin 81. 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  Short lasting.  The second episode he was admitted in [place], stroke work-up negative.  Hypertension Stable Long term BP goal normotensive  Hyperlipidemia Home meds: None LDL 123, goal < 70 Now on Lipitor 40 Continue statin at discharge  Tobacco abuse Current smoker Smoking cessation counseling provided Pt is willing to quit  Other Stroke Risk Factors ETOH use, educated on limitation of alcohol use  Other Active Problems Mild AKI, creatinine 1.14-1.37  Hospital day # 0    Marvel Plan, MD PhD Stroke Neurology 02/03/2022 12:56 PM    To contact Stroke Continuity provider, please refer to [email or web address]. After hours, contact General Neurology

## 2022-02-03 NOTE — Plan of Care (Signed)
  Problem: Education: Goal: Knowledge of General Education information will improve Description: Including pain rating scale, medication(s)/side effects and non-pharmacologic comfort measures Outcome: Progressing   Problem: Health Behavior/Discharge Planning: Goal: Ability to manage health-related needs will improve Outcome: Progressing   Problem: Clinical Measurements: Goal: Ability to maintain clinical measurements within normal limits will improve Outcome: Progressing Goal: Will remain free from infection Outcome: Progressing Goal: Diagnostic test results will improve Outcome: Progressing Goal: Respiratory complications will improve Outcome: Progressing Goal: Cardiovascular complication will be avoided Outcome: Progressing   Problem: Activity: Goal: Risk for activity intolerance will decrease Outcome: Progressing   Problem: Nutrition: Goal: Adequate nutrition will be maintained Outcome: Progressing   Problem: Coping: Goal: Level of anxiety will decrease Outcome: Progressing   Problem: Elimination: Goal: Will not experience complications related to bowel motility Outcome: Progressing Goal: Will not experience complications related to urinary retention Outcome: Progressing   Problem: Safety: Goal: Ability to remain free from injury will improve Outcome: Progressing   Problem: Skin Integrity: Goal: Risk for impaired skin integrity will decrease Outcome: Progressing   Problem: Pain Managment: Goal: General experience of comfort will improve Outcome: Progressing   Problem: Education: Goal: Knowledge of disease or condition will improve Outcome: Progressing Goal: Knowledge of secondary prevention will improve (SELECT ALL) Outcome: Progressing Goal: Knowledge of patient specific risk factors will improve (INDIVIDUALIZE FOR PATIENT) Outcome: Progressing Goal: Individualized Educational Video(s) Outcome: Progressing   Problem: Health Behavior/Discharge  Planning: Goal: Ability to manage health-related needs will improve Outcome: Progressing   Problem: Self-Care: Goal: Ability to participate in self-care as condition permits will improve Outcome: Progressing Goal: Verbalization of feelings and concerns over difficulty with self-care will improve Outcome: Progressing Goal: Ability to communicate needs accurately will improve Outcome: Progressing   Problem: Ischemic Stroke/TIA Tissue Perfusion: Goal: Complications of ischemic stroke/TIA will be minimized Outcome: Progressing

## 2022-02-03 NOTE — TOC Initial Note (Signed)
Transition of Care [facility]) - Initial/Assessment Note    Patient Details  Name: Carl Armstrong MRN: [id number] Date of Birth: 1971/03/12  Transition [facility]) CM/SW Contact:    Lawerance Sabal, RN Phone Number: 02/03/2022, 2:56 PM  Clinical Narrative:           Sherron Monday w patient at bedside. He states he lives in [place], but currently is staying at a [employer] at [employer] [place] [address]. He states that his healthacre is covered by [employer], althoug he listed as uninsured. He states that he has applied for health insurance through the marketplace and he is unsure of when it will start.  Since he will be in [place] for some time after DC he would like to follow up at the [facility]. I have messaged resident Towanda Octave requesting follow up appointment be made, MD agreeable to facilitating.  He would like OP PT referral made to [facility], done.       Expected Discharge Plan: Home/Self Care Barriers to Discharge: Continued Medical Work up   Patient Goals and CMS Choice Patient states their goals for this hospitalization and ongoing recovery are:: return to halfway house      Expected Discharge Plan and Services Expected Discharge Plan: Home/Self Care                                              Prior Living Arrangements/Services   Lives with:: Self                   Activities of Daily Living Home Assistive Devices/Equipment: None ADL Screening (condition at time of admission) Patient's cognitive ability adequate to safely complete daily activities?: Yes Is the patient deaf or have difficulty hearing?: No Does the patient have difficulty seeing, even when wearing glasses/contacts?: No Does the patient have difficulty concentrating, remembering, or making decisions?: No Patient able to express need for assistance with ADLs?: Yes Does the patient have difficulty dressing or bathing?: No Independently  performs ADLs?: Yes (appropriate for developmental age) Does the patient have difficulty walking or climbing stairs?: No Weakness of Legs: Left Weakness of Arms/Hands: Left  Permission Sought/Granted                  Emotional Assessment              Admission diagnosis:  TIA (transient ischemic attack) [G45.9] Stroke [facility]) [I63.9] Patient Active Problem List   Diagnosis Date Noted   Primary hypertension    Prediabetes    Stroke (HCC) 02/02/2022   PCP:  Patient, No Pcp Per (Inactive) Pharmacy:   [id number] - [place], [place] - [address] AT [facility] [address] [place] [address] Phone: [phone] Fax: [phone]     Social Determinants of Health (SDOH) Interventions    Readmission Risk Interventions     View : No data to display.

## 2022-02-03 NOTE — Progress Notes (Signed)
PT Cancellation Note  Patient Details Name: Carl Armstrong MRN: [id number] DOB: 1971/05/01   Cancelled Treatment:    Reason Eval/Treat Not Completed: Other (comment)  Patient sleeping soundly. Not easily awakened. Will return to complete mobility assessment   Arby Barrette, PT Acute Rehabilitation Services  Pager [phone] Office [phone]   Carl Armstrong 02/03/2022, 7:37 AM

## 2022-02-03 NOTE — Consult Note (Signed)
Chief Complaint: Patient was seen in consultation today for left arm and leg weakness.   Referring Physician(s): Dr. Lottie Rater  Supervising Physician: Luanne Bras  Patient Status: [facility] - In-pt  History of Present Illness: Carl Armstrong is a 51 y.o. male with history of brain aneurysms repaired in 2017 at [facility].  He has followed with Neurology there and reports he undergoes regular MRI vs. MRA for surveillance.  He has been doing well until yesterday when he again developed arm and weakness.  He presented to the ED for evaluation.   CTA Head Neck showed: 1. 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 The stent is patent; detection of in stent stenosis/embolic source is limited due to unavoidable artifact. No stenosis or embolic source seen in the more proximal arterial circulation. 2. Asymmetric lack of enhancement at the right cavernous sinus presumably chronic in this clinical setting. 3.  Emphysema (ICD10-J43.9).  IR consulted for diagnostic angiogram at the request of Dr. Erlinda Hong.  Dr. Estanislado Pandy has reviewed the case and approves patient for procedure.   PA to bedside to discuss with patient who is agreeable to proceed.   Past Medical History:  Diagnosis Date   Brain aneurysm 2017   Hypertension     Past Surgical History:  Procedure Laterality Date   BRAIN SURGERY      Allergies: Patient has no known allergies.  Medications: Prior to Admission medications   Medication Sig Start Date End Date Taking? Authorizing Provider  aspirin EC 81 MG tablet Take 81 mg by mouth daily. Swallow whole.   Yes [provider]  losartan (COZAAR) 25 MG tablet Take 25 mg by mouth daily.   Yes [provider]  mirtazapine (REMERON) 15 MG tablet Take 15 mg by mouth at bedtime.   Yes [provider]     History reviewed. No pertinent family history.  Social History   Socioeconomic History   Marital status: Married    Spouse name: Not on  file   Number of children: Not on file   Years of education: Not on file   Highest education level: Not on file  Occupational History   Not on file  Tobacco Use   Smoking status: Every Day    Types: Cigarettes   Smokeless tobacco: Never  Substance and Sexual Activity   Alcohol use: Not Currently   Drug use: Not Currently   Sexual activity: Yes  Other Topics Concern   Not on file  Social History Narrative   Not on file   Social Determinants of Health   Financial Resource Strain: Not on file  Food Insecurity: Not on file  Transportation Needs: Not on file  Physical Activity: Not on file  Stress: Not on file  Social Connections: Not on file     Review of Systems: A 12 point ROS discussed and pertinent positives are indicated in the HPI above.  All other systems are negative.  Review of Systems  Constitutional:  Negative for fatigue and fever.  Respiratory:  Negative for cough and shortness of breath.   Cardiovascular:  Negative for chest pain.  Gastrointestinal:  Negative for abdominal pain.  Musculoskeletal:  Negative for back pain and gait problem.  Neurological:  Positive for weakness (left arm and leg weakness).  Psychiatric/Behavioral:  Negative for behavioral problems and confusion.    Vital Signs: BP 127/82 (BP Location: Right Arm)   Pulse 69   Temp 98.5 F (36.9 C) (Oral)   Resp 20   Ht  5\' 5"  (1.651 m)   Wt 154 lb 15.7 oz (70.3 kg)   SpO2 96%   BMI 25.79 kg/m   Physical Exam Vitals and nursing note reviewed.  Constitutional:      General: He is not in acute distress.    Appearance: Normal appearance. He is not ill-appearing.  HENT:     Mouth/Throat:     Mouth: Mucous membranes are moist.     Pharynx: Oropharynx is clear.  Cardiovascular:     Rate and Rhythm: Normal rate and regular rhythm.  Pulmonary:     Effort: Pulmonary effort is normal.     Breath sounds: Normal breath sounds.  Neurological:     General: No focal deficit present.      Mental Status: He is alert and oriented to person, place, and time. Mental status is at baseline.  Psychiatric:        Mood and Affect: Mood normal.        Behavior: Behavior normal.        Thought Content: Thought content normal.        Judgment: Judgment normal.     MD Evaluation Airway: WNL Heart: WNL Abdomen: WNL Chest/ Lungs: WNL ASA  Classification: 3 Mallampati/Airway Score: Two   Imaging: CT ANGIO HEAD NECK W WO CM  Result Date: 02/02/2022 CLINICAL DATA:  Stroke follow-up EXAM: CT ANGIOGRAPHY HEAD AND NECK TECHNIQUE: Multidetector CT imaging of the head and neck was performed using the standard protocol during bolus administration of intravenous contrast. Multiplanar CT image reconstructions and MIPs were obtained to evaluate the vascular anatomy. Carotid stenosis measurements (when applicable) are obtained utilizing NASCET criteria, using the distal internal carotid diameter as the denominator. RADIATION DOSE REDUCTION: This exam was performed according to the departmental dose-optimization program which includes automated exposure control, adjustment of the mA and/or kV according to patient size and/or use of iterative reconstruction technique. CONTRAST:  58mL OMNIPAQUE IOHEXOL 350 MG/ML SOLN COMPARISON:  Preceding brain MRI FINDINGS: CT HEAD FINDINGS Brain: Acute cortical infarcts along the right cerebral convexity, underestimated relative to prior MRI. No acute hemorrhage, hydrocephalus, or masslike finding. Vascular: See below Skull: Negative Sinuses: Clear Orbits: Negative Review of the MIP images confirms the above findings CTA NECK FINDINGS Aortic arch: Limited coverage is negative.  Three vessel branching. Right carotid system: Vessels are smooth and diffusely patent without atheromatous change. Left carotid system: Vessels are smooth and diffusely patent without atheromatous change. Vertebral arteries: No proximal subclavian stenosis. Both vertebral arteries are smoothly  contoured and widely patent to the dura. Skeleton: No acute finding. Other neck: No evidence of mass or inflammation Upper chest: Biapical centrilobular emphysema. Review of the MIP images confirms the above findings CTA HEAD FINDINGS Anterior circulation: Stent assisted right ICA aneurysm coiling with 2 coil masses. There is unavoidable artifact at the level of the stent and coil masses. Visible flow within the stent and no downstream diminished flow. Dominant right A1 segment. No evidence of branch occlusion, beading, or untreated aneurysm Posterior circulation: The vertebral and basilar arteries are smoothly contoured and widely patent. No branch occlusion, beading, or aneurysm. Venous sinuses: Diffusely opacified. There is asymmetric enhancement in the left cavernous sinus. No secondary signs of carotid cavernous fistula or acute venous thrombosis on the right, presumably chronic and possibly from prior perianeurysmal inflammation. Anatomic variants: As above Review of the MIP images confirms the above findings IMPRESSION: 1. 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 The stent is patent; detection of in stent stenosis/embolic  source is limited due to unavoidable artifact. No stenosis or embolic source seen in the more proximal arterial circulation. 2. Asymmetric lack of enhancement at the right cavernous sinus presumably chronic in this clinical setting. 3.  Emphysema (ICD10-J43.9). Electronically Signed   By: Jorje Guild M.D.   On: 02/02/2022 12:11   MR BRAIN WO CONTRAST  Result Date: 02/02/2022 CLINICAL DATA:  Left-sided weakness since last night EXAM: MRI HEAD WITHOUT CONTRAST TECHNIQUE: Multiplanar, multiecho pulse sequences of the brain and surrounding structures were obtained without intravenous contrast. COMPARISON:  None Available. FINDINGS: Brain: Small acute cortical infarcts scattered along the right superior frontal, superior parietal, and upper occipital cortex, right MCA  periphery/watershed distribution. Mild petechial hemorrhage associated with some of the infarcts. No hematoma, hydrocephalus, collection, or masslike finding. Vascular: Major flow voids are preserved Skull and upper cervical spine: No focal marrow lesion. Sinuses/Orbits: Negative IMPRESSION: Scattered acute infarcts along the right cerebral convexity, MCA watershed distribution. Electronically Signed   By: Jorje Guild M.D.   On: 02/02/2022 10:28   ECHOCARDIOGRAM COMPLETE  Result Date: 02/03/2022    ECHOCARDIOGRAM REPORT   Patient Name:   Carl Armstrong Date of Exam: 02/03/2022 Medical Rec #:  [id number]    Height:       65.0 in Accession #:    [id number]   Weight:       155.0 lb Date of Birth:  Jun 26, 1971    BSA:          1.775 m Patient Age:    3 years     BP:           100/58 mmHg Patient Gender: M            HR:           73 bpm. Exam Location:  Inpatient Procedure: 2D Echo, Cardiac Doppler and Color Doppler Indications:    Stroke  History:        Patient has no prior history of Echocardiogram examinations.                 Risk Factors:Hypertension and Current Smoker.  Sonographer:    Clayton Lefort RDCS (AE) Referring Phys: [place]  1. Left ventricular ejection fraction, by estimation, is 60 to 65%. The left ventricle has normal function. The left ventricle has no regional wall motion abnormalities. Left ventricular diastolic parameters were normal.  2. Right ventricular systolic function is low normal. The right ventricular size is mildly enlarged. Tricuspid regurgitation signal is inadequate for assessing PA pressure.  3. The mitral valve is abnormal. Mild mitral valve regurgitation.  4. The aortic valve is tricuspid. Aortic valve regurgitation is not visualized.  5. The inferior vena cava is normal in size with greater than 50% respiratory variability, suggesting right atrial pressure of 3 mmHg. Comparison(s): No prior Echocardiogram. FINDINGS  Left Ventricle: Left ventricular  ejection fraction, by estimation, is 60 to 65%. The left ventricle has normal function. The left ventricle has no regional wall motion abnormalities. The left ventricular internal cavity size was normal in size. There is  no left ventricular hypertrophy. Left ventricular diastolic parameters were normal. Right Ventricle: The right ventricular size is mildly enlarged. No increase in right ventricular wall thickness. Right ventricular systolic function is low normal. Tricuspid regurgitation signal is inadequate for assessing PA pressure. Left Atrium: Left atrial size was normal in size. Right Atrium: Right atrial size was normal in size. Pericardium: There is no evidence of pericardial effusion. Mitral  Valve: The mitral valve is abnormal. There is mild thickening of the posterior and anterior mitral valve leaflet(s). Mild mitral valve regurgitation. Tricuspid Valve: The tricuspid valve is grossly normal. Tricuspid valve regurgitation is trivial. Aortic Valve: The aortic valve is tricuspid. Aortic valve regurgitation is not visualized. Aortic valve mean gradient measures 2.0 mmHg. Aortic valve peak gradient measures 4.2 mmHg. Aortic valve area, by VTI measures 3.14 cm. Pulmonic Valve: The pulmonic valve was grossly normal. Pulmonic valve regurgitation is mild. Aorta: The aortic root and ascending aorta are structurally normal, with no evidence of dilitation. Venous: The inferior vena cava is normal in size with greater than 50% respiratory variability, suggesting right atrial pressure of 3 mmHg. IAS/Shunts: No atrial level shunt detected by color flow Doppler.  LEFT VENTRICLE PLAX 2D LVIDd:         4.40 cm   Diastology LVIDs:         2.80 cm   LV e' medial:    9.46 cm/s LV PW:         1.00 cm   LV E/e' medial:  7.4 LV IVS:        1.00 cm   LV e' lateral:   10.90 cm/s LVOT diam:     2.30 cm   LV E/e' lateral: 6.4 LV SV:         69 LV SV Index:   39 LVOT Area:     4.15 cm  RIGHT VENTRICLE            IVC RV Basal diam:   2.70 cm    IVC diam: 1.30 cm RV S prime:     9.25 cm/s TAPSE (M-mode): 2.5 cm LEFT ATRIUM             Index        RIGHT ATRIUM           Index LA diam:        2.70 cm 1.52 cm/m   RA Area:     14.50 cm LA Vol (A2C):   28.7 ml 16.17 ml/m  RA Volume:   33.50 ml  18.87 ml/m LA Vol (A4C):   47.7 ml 26.87 ml/m LA Biplane Vol: 37.9 ml 21.35 ml/m  AORTIC VALVE AV Area (Vmax):    3.43 cm AV Area (Vmean):   3.37 cm AV Area (VTI):     3.14 cm AV Vmax:           103.00 cm/s AV Vmean:          65.400 cm/s AV VTI:            0.218 m AV Peak Grad:      4.2 mmHg AV Mean Grad:      2.0 mmHg LVOT Vmax:         85.00 cm/s LVOT Vmean:        53.000 cm/s LVOT VTI:          0.165 m LVOT/AV VTI ratio: 0.76  AORTA Ao Root diam: 3.20 cm Ao Asc diam:  2.90 cm MITRAL VALVE MV Area (PHT): 3.12 cm    SHUNTS MV Decel Time: 243 msec    Systemic VTI:  0.16 m MV E velocity: 69.70 cm/s  Systemic Diam: 2.30 cm MV A velocity: 43.20 cm/s MV E/A ratio:  1.61 Lyman Bishop MD Electronically signed by Lyman Bishop MD Signature Date/Time: 02/03/2022/12:00:58 PM    Final     Labs:  CBC: Recent Labs    02/02/22 [id number]  02/03/22 0055  WBC 11.4* 10.3  HGB 13.0 13.3  HCT 40.3 41.9  PLT 357 397    COAGS: No results for input(s): INR, APTT in the last 8760 hours.  BMP: Recent Labs    02/02/22 0552 02/03/22 0055  NA 139 138  K 3.7 3.1*  CL 103 103  CO2 28 26  GLUCOSE 101* 122*  BUN 8 14  CALCIUM 8.6* 9.2  CREATININE 1.14 1.37*  GFRNONAA >60 >60    LIVER FUNCTION TESTS: No results for input(s): BILITOT, AST, ALT, ALKPHOS, PROT, ALBUMIN in the last 8760 hours.  TUMOR MARKERS: No results for input(s): AFPTM, CEA, CA199, CHROMGRNA in the last 8760 hours.  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  NIR  consulted by Neurology. Dr. Estanislado Pandy has reviewed the case and approves patient for diagnostic angiogram.  Plan to proceed as early as Tuesday as schedule allows.   Risks and benefits were discussed with the patient including, but not limited to bleeding, infection, vascular injury or contrast induced renal failure.  This interventional procedure involves the use of X-rays and because of the nature of the planned procedure, it is possible that we will have prolonged use of X-ray fluoroscopy.  Potential radiation risks to you include (but are not limited to) the following: - A slightly elevated risk for cancer  several years later in life. This risk is typically less than 0.5% percent. This risk is low in comparison to the normal incidence of human cancer, which is 33% for women and 50% for men according to the [place]. - Radiation induced injury can include skin redness, resembling a rash, tissue breakdown / ulcers and hair loss (which can be temporary or permanent).   The likelihood of either of these occurring depends on the difficulty of the procedure and whether you are sensitive to radiation due to previous procedures, disease, or genetic conditions.   IF your procedure requires a prolonged use of radiation, you will be notified and given written instructions for further action.  It is your responsibility to monitor the irradiated area for the 2 weeks following the procedure and to notify your physician if you are concerned that you have suffered a radiation induced injury.    All of the patient's questions were answered, patient is agreeable to proceed.  Consent signed and in chart.  Thank you for this interesting consult.  I greatly enjoyed meeting Wesner Rozzelle and look forward to participating in their care.  A copy of this report was sent to the requesting provider on this date.  Electronically Signed: Docia Barrier, PA 02/03/2022, 3:10 PM   I spent a  total of 20 Minutes    in face to face in clinical consultation, greater than 50% of which was counseling/coordinating care for right MCA stroke.

## 2022-02-03 NOTE — Progress Notes (Signed)
  Echocardiogram 2D Echocardiogram has been performed.  Gerda Diss 02/03/2022, 11:23 AM

## 2022-02-04 DIAGNOSIS — R200 Anesthesia of skin: Secondary | ICD-10-CM | POA: Diagnosis present

## 2022-02-04 DIAGNOSIS — R7303 Prediabetes: Secondary | ICD-10-CM

## 2022-02-04 DIAGNOSIS — G8194 Hemiplegia, unspecified affecting left nondominant side: Secondary | ICD-10-CM | POA: Diagnosis present

## 2022-02-04 DIAGNOSIS — I63511 Cerebral infarction due to unspecified occlusion or stenosis of right middle cerebral artery: Secondary | ICD-10-CM | POA: Diagnosis present

## 2022-02-04 DIAGNOSIS — Z79899 Other long term (current) drug therapy: Secondary | ICD-10-CM | POA: Diagnosis not present

## 2022-02-04 DIAGNOSIS — I10 Essential (primary) hypertension: Secondary | ICD-10-CM | POA: Diagnosis present

## 2022-02-04 DIAGNOSIS — F17210 Nicotine dependence, cigarettes, uncomplicated: Secondary | ICD-10-CM | POA: Diagnosis present

## 2022-02-04 DIAGNOSIS — G459 Transient cerebral ischemic attack, unspecified: Principal | ICD-10-CM

## 2022-02-04 DIAGNOSIS — Z7982 Long term (current) use of aspirin: Secondary | ICD-10-CM | POA: Diagnosis not present

## 2022-02-04 DIAGNOSIS — Z8679 Personal history of other diseases of the circulatory system: Secondary | ICD-10-CM | POA: Diagnosis not present

## 2022-02-04 LAB — BASIC METABOLIC PANEL
Anion gap: 7 (ref 5–15)
BUN: 13 mg/dL (ref 6–20)
CO2: 25 mmol/L (ref 22–32)
Calcium: 8.9 mg/dL (ref 8.9–10.3)
Chloride: 107 mmol/L (ref 98–111)
Creatinine, Ser: 1.56 mg/dL — ABNORMAL HIGH (ref 0.61–1.24)
GFR, Estimated: 54 mL/min — ABNORMAL LOW (ref >60)
Glucose, Bld: 83 mg/dL (ref 70–99)
Potassium: 3.9 mmol/L (ref 3.5–5.1)
Sodium: 139 mmol/L (ref 135–145)

## 2022-02-04 MED ORDER — NICOTINE 7 MG/24HR TD PT24: 7.0000 mg | MEDICATED_PATCH | Freq: Every day | TRANSDERMAL | Status: DC | PRN

## 2022-02-04 MED ORDER — TRAMADOL HCL 50 MG PO TABS
50.0000 mg | ORAL_TABLET | Freq: Two times a day (BID) | ORAL | Status: DC | PRN
  Administered 2022-02-04: 50 mg via ORAL
  Filled 2022-02-04: qty 1

## 2022-02-04 NOTE — Progress Notes (Signed)
STROKE TEAM PROGRESS NOTE   SUBJECTIVE (INTERVAL HISTORY) No family is at the bedside.  No acute event overnight, pending cerebral angiogram in a.m.   OBJECTIVE Temp:  [97.7 F (36.5 C)-99.3 F (37.4 C)] 98.6 F (37 C) (05/29 1655) Pulse Rate:  [65-88] 65 (05/29 1655) Cardiac Rhythm: Normal sinus rhythm (05/29 0713) Resp:  [16-23] 20 (05/29 1655) BP: (100-141)/(62-95) 132/86 (05/29 1655) SpO2:  [97 %-100 %] 100 % (05/29 1655)  No results for input(s): GLUCAP in the last 168 hours. Recent Labs  Lab 02/02/22 0552 02/03/22 0055 02/04/22 0109  NA 139 138 139  K 3.7 3.1* 3.9  CL 103 103 107  CO2 28 26 25   GLUCOSE 101* 122* 83  BUN 8 14 13   CREATININE 1.14 1.37* 1.56*  CALCIUM 8.6* 9.2 8.9   No results for input(s): AST, ALT, ALKPHOS, BILITOT, PROT, ALBUMIN in the last 168 hours. Recent Labs  Lab 02/02/22 0552 02/03/22 0055  WBC 11.4* 10.3  NEUTROABS 8.7*  --   HGB 13.0 13.3  HCT 40.3 41.9  MCV 73.9* 73.8*  PLT 357 397   No results for input(s): CKTOTAL, CKMB, CKMBINDEX, TROPONINI in the last 168 hours. No results for input(s): LABPROT, INR in the last 72 hours. No results for input(s): COLORURINE, LABSPEC, PHURINE, GLUCOSEU, HGBUR, BILIRUBINUR, KETONESUR, PROTEINUR, UROBILINOGEN, NITRITE, LEUKOCYTESUR in the last 72 hours.  Invalid input(s): APPERANCEUR     Component Value Date/Time   CHOL 193 02/03/2022 0055   TRIG 120 02/03/2022 0055   HDL 46 02/03/2022 0055   CHOLHDL 4.2 02/03/2022 0055   VLDL 24 02/03/2022 0055   LDLCALC 123 (H) 02/03/2022 0055   Lab Results  Component Value Date   HGBA1C 6.1 (H) 02/03/2022      Component Value Date/Time   LABOPIA NONE DETECTED 02/03/2022 2100   COCAINSCRNUR NONE DETECTED 02/03/2022 2100   LABBENZ NONE DETECTED 02/03/2022 2100   AMPHETMU NONE DETECTED 02/03/2022 2100   THCU NONE DETECTED 02/03/2022 2100   LABBARB NONE DETECTED 02/03/2022 2100    No results for input(s): ETH in the last 168 hours.  I have  personally reviewed the radiological images below and agree with the radiology interpretations.  CT ANGIO HEAD NECK W WO CM  Result Date: 02/02/2022 CLINICAL DATA:  Stroke follow-up EXAM: CT ANGIOGRAPHY HEAD AND NECK TECHNIQUE: Multidetector CT imaging of the head and neck was performed using the standard protocol during bolus administration of intravenous contrast. Multiplanar CT image reconstructions and MIPs were obtained to evaluate the vascular anatomy. Carotid stenosis measurements (when applicable) are obtained utilizing NASCET criteria, using the distal internal carotid diameter as the denominator. RADIATION DOSE REDUCTION: This exam was performed according to the departmental dose-optimization program which includes automated exposure control, adjustment of the mA and/or kV according to patient size and/or use of iterative reconstruction technique. CONTRAST:  65mL OMNIPAQUE IOHEXOL 350 MG/ML SOLN COMPARISON:  Preceding brain MRI FINDINGS: CT HEAD FINDINGS Brain: Acute cortical infarcts along the right cerebral convexity, underestimated relative to prior MRI. No acute hemorrhage, hydrocephalus, or masslike finding. Vascular: See below Skull: Negative Sinuses: Clear Orbits: Negative Review of the MIP images confirms the above findings CTA NECK FINDINGS Aortic arch: Limited coverage is negative.  Three vessel branching. Right carotid system: Vessels are smooth and diffusely patent without atheromatous change. Left carotid system: Vessels are smooth and diffusely patent without atheromatous change. Vertebral arteries: No proximal subclavian stenosis. Both vertebral arteries are smoothly contoured and widely patent to the dura. Skeleton: No acute  finding. Other neck: No evidence of mass or inflammation Upper chest: Biapical centrilobular emphysema. Review of the MIP images confirms the above findings CTA HEAD FINDINGS Anterior circulation: Stent assisted right ICA aneurysm coiling with 2 coil masses. There  is unavoidable artifact at the level of the stent and coil masses. Visible flow within the stent and no downstream diminished flow. Dominant right A1 segment. No evidence of branch occlusion, beading, or untreated aneurysm Posterior circulation: The vertebral and basilar arteries are smoothly contoured and widely patent. No branch occlusion, beading, or aneurysm. Venous sinuses: Diffusely opacified. There is asymmetric enhancement in the left cavernous sinus. No secondary signs of carotid cavernous fistula or acute venous thrombosis on the right, presumably chronic and possibly from prior perianeurysmal inflammation. Anatomic variants: As above Review of the MIP images confirms the above findings IMPRESSION: 1. 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 The stent is patent; detection of in stent stenosis/embolic source is limited due to unavoidable artifact. No stenosis or embolic source seen in the more proximal arterial circulation. 2. Asymmetric lack of enhancement at the right cavernous sinus presumably chronic in this clinical setting. 3.  Emphysema (ICD10-J43.9). Electronically Signed   By: Tiburcio Pea M.D.   On: 02/02/2022 12:11   MR BRAIN WO CONTRAST  Result Date: 02/02/2022 CLINICAL DATA:  Left-sided weakness since last night EXAM: MRI HEAD WITHOUT CONTRAST TECHNIQUE: Multiplanar, multiecho pulse sequences of the brain and surrounding structures were obtained without intravenous contrast. COMPARISON:  None Available. FINDINGS: Brain: Small acute cortical infarcts scattered along the right superior frontal, superior parietal, and upper occipital cortex, right MCA periphery/watershed distribution. Mild petechial hemorrhage associated with some of the infarcts. No hematoma, hydrocephalus, collection, or masslike finding. Vascular: Major flow voids are preserved Skull and upper cervical spine: No focal marrow lesion. Sinuses/Orbits: Negative IMPRESSION: Scattered acute infarcts along the  right cerebral convexity, MCA watershed distribution. Electronically Signed   By: Tiburcio Pea M.D.   On: 02/02/2022 10:28   ECHOCARDIOGRAM COMPLETE  Result Date: 02/03/2022    ECHOCARDIOGRAM REPORT   Patient Name:   JADD GASIOR Date of Exam: 02/03/2022 Medical Rec #:  [id number]    Height:       65.0 in Accession #:    [id number]   Weight:       155.0 lb Date of Birth:  1970/10/01    BSA:          1.775 m Patient Age:    50 years     BP:           100/58 mmHg Patient Gender: M            HR:           73 bpm. Exam Location:  Inpatient Procedure: 2D Echo, Cardiac Doppler and Color Doppler Indications:    Stroke  History:        Patient has no prior history of Echocardiogram examinations.                 Risk Factors:Hypertension and Current Smoker.  Sonographer:    Ross Ludwig RDCS (AE) Referring Phys: [id number] Estevan Ryder MCINTYRE IMPRESSIONS  1. Left ventricular ejection fraction, by estimation, is 60 to 65%. The left ventricle has normal function. The left ventricle has no regional wall motion abnormalities. Left ventricular diastolic parameters were normal.  2. Right ventricular systolic function is low normal. The right ventricular size is mildly enlarged. Tricuspid regurgitation signal is inadequate for assessing PA pressure.  3. The  mitral valve is abnormal. Mild mitral valve regurgitation.  4. The aortic valve is tricuspid. Aortic valve regurgitation is not visualized.  5. The inferior vena cava is normal in size with greater than 50% respiratory variability, suggesting right atrial pressure of 3 mmHg. Comparison(s): No prior Echocardiogram. FINDINGS  Left Ventricle: Left ventricular ejection fraction, by estimation, is 60 to 65%. The left ventricle has normal function. The left ventricle has no regional wall motion abnormalities. The left ventricular internal cavity size was normal in size. There is  no left ventricular hypertrophy. Left ventricular diastolic parameters were normal. Right Ventricle: The  right ventricular size is mildly enlarged. No increase in right ventricular wall thickness. Right ventricular systolic function is low normal. Tricuspid regurgitation signal is inadequate for assessing PA pressure. Left Atrium: Left atrial size was normal in size. Right Atrium: Right atrial size was normal in size. Pericardium: There is no evidence of pericardial effusion. Mitral Valve: The mitral valve is abnormal. There is mild thickening of the posterior and anterior mitral valve leaflet(s). Mild mitral valve regurgitation. Tricuspid Valve: The tricuspid valve is grossly normal. Tricuspid valve regurgitation is trivial. Aortic Valve: The aortic valve is tricuspid. Aortic valve regurgitation is not visualized. Aortic valve mean gradient measures 2.0 mmHg. Aortic valve peak gradient measures 4.2 mmHg. Aortic valve area, by VTI measures 3.14 cm. Pulmonic Valve: The pulmonic valve was grossly normal. Pulmonic valve regurgitation is mild. Aorta: The aortic root and ascending aorta are structurally normal, with no evidence of dilitation. Venous: The inferior vena cava is normal in size with greater than 50% respiratory variability, suggesting right atrial pressure of 3 mmHg. IAS/Shunts: No atrial level shunt detected by color flow Doppler.  LEFT VENTRICLE PLAX 2D LVIDd:         4.40 cm   Diastology LVIDs:         2.80 cm   LV e' medial:    9.46 cm/s LV PW:         1.00 cm   LV E/e' medial:  7.4 LV IVS:        1.00 cm   LV e' lateral:   10.90 cm/s LVOT diam:     2.30 cm   LV E/e' lateral: 6.4 LV SV:         69 LV SV Index:   39 LVOT Area:     4.15 cm  RIGHT VENTRICLE            IVC RV Basal diam:  2.70 cm    IVC diam: 1.30 cm RV S prime:     9.25 cm/s TAPSE (M-mode): 2.5 cm LEFT ATRIUM             Index        RIGHT ATRIUM           Index LA diam:        2.70 cm 1.52 cm/m   RA Area:     14.50 cm LA Vol (A2C):   28.7 ml 16.17 ml/m  RA Volume:   33.50 ml  18.87 ml/m LA Vol (A4C):   47.7 ml 26.87 ml/m LA Biplane  Vol: 37.9 ml 21.35 ml/m  AORTIC VALVE AV Area (Vmax):    3.43 cm AV Area (Vmean):   3.37 cm AV Area (VTI):     3.14 cm AV Vmax:           103.00 cm/s AV Vmean:          65.400 cm/s AV VTI:  0.218 m AV Peak Grad:      4.2 mmHg AV Mean Grad:      2.0 mmHg LVOT Vmax:         85.00 cm/s LVOT Vmean:        53.000 cm/s LVOT VTI:          0.165 m LVOT/AV VTI ratio: 0.76  AORTA Ao Root diam: 3.20 cm Ao Asc diam:  2.90 cm MITRAL VALVE MV Area (PHT): 3.12 cm    SHUNTS MV Decel Time: 243 msec    Systemic VTI:  0.16 m MV E velocity: 69.70 cm/s  Systemic Diam: 2.30 cm MV A velocity: 43.20 cm/s MV E/A ratio:  1.61 Zoila ShutterKenneth Hilty MD Electronically signed by Zoila ShutterKenneth Hilty MD Signature Date/Time: 02/03/2022/12:00:58 PM    Final      PHYSICAL EXAM  Temp:  [97.7 F (36.5 C)-99.3 F (37.4 C)] 98.6 F (37 C) (05/29 1655) Pulse Rate:  [65-88] 65 (05/29 1655) Resp:  [16-23] 20 (05/29 1655) BP: (100-141)/(62-95) 132/86 (05/29 1655) SpO2:  [97 %-100 %] 100 % (05/29 1655)  General - Well nourished, well developed, in no apparent distress.  Ophthalmologic - fundi not visualized due to noncooperation.  Cardiovascular - Regular rhythm and rate.  Mental Status -  Level of arousal and orientation to time, place, and person were intact. Language including expression, naming, repetition, comprehension was assessed and found intact. Attention span and concentration were normal. Fund of Knowledge was assessed and was intact.  Cranial Nerves II - XII - II - Visual field intact OU. III, IV, VI - Extraocular movements intact. V - Facial sensation intact bilaterally. VII - Facial movement intact bilaterally. VIII - Hearing & vestibular intact bilaterally. X - Palate elevates symmetrically. XI - Chin turning & shoulder shrug intact bilaterally. XII - Tongue protrusion intact.  Motor Strength - The patient's strength was normal in all extremities and pronator drift was absent except left foot drop with DF  0/5, PF 3/5.  Bulk was normal and fasciculations were absent.   Motor Tone - Muscle tone was assessed at the neck and appendages and was normal.  Reflexes - The patient's reflexes were symmetrical in all extremities and he had no pathological reflexes.  Sensory - Light touch, temperature/pinprick were assessed and were symmetrical except decreased light touch sensation at left LE.    Coordination - The patient had normal movements in the hands and right foot with no ataxia or dysmetria.  However, left LTS ataxic. Tremor was absent.  Gait and Station - deferred.   ASSESSMENT/PLAN Mr. Baruch Goldmannndrew Capri is a 51 y.o. male with history of hypertension, smoker, alcohol abuse, cerebral aneurysm status post coil embolization and pipeline stent, TIA admitted for left-sided weakness. No tPA given due to outside window.    Stroke:  right MCA and ACA scattered small infarcts, concerning for right ICA and ACA stenting/coil related MRI right ICA and MCA scattered infarcts CT head and neck status post right intracranial ICA aneurysm coiling and pipeline stent, which is patent.  Detection of in-stent stenosis/embolic source is limited due to artifact. Cerebral angiogram pending, plan for 5/30 with Dr. Corliss Skainseveshwar 2D Echo EF 60 to 65% LDL 123 HgbA1c 6.1 UDS negative Lovenox for VTE prophylaxis aspirin 81 mg daily prior to admission, now on aspirin 81 mg daily and clopidogrel 75 mg daily DAPT. Patient counseled to be compliant with his antithrombotic medications Ongoing aggressive stroke risk factor management Therapy recommendations: Outpatient PT Disposition: Pending  Cerebral aneurysm 10/2015 postcoital  headache, admitted to [facility], found to have diffuse SAH on CT.  CTA head and neck showed right A1 and right terminal ICA aneurysms.  Status post coil embolization. 05/2016 follow-up cerebral angiogram in [facility], developed headache and blurry vision, confusion and aphasia post angiogram.  CT negative, CTA head and  neck negative.  Discharged on aspirin 81. 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  Short lasting.  The second episode he was admitted in [place], stroke work-up negative.  Hypertension Stable Long term BP goal normotensive  Hyperlipidemia Home meds: None LDL 123, goal < 70 Now on Lipitor 40 Continue statin at discharge  Tobacco abuse Current smoker Smoking cessation counseling provided Pt is willing to quit  Other Stroke Risk Factors ETOH use, educated on limitation of alcohol use  Other Active Problems Mild AKI, creatinine 1.14-1.37  Hospital day # 0    Marvel Plan, MD PhD Stroke Neurology 02/04/2022 6:27 PM    To contact Stroke Continuity provider, please refer to [email or web address]. After hours, contact General Neurology

## 2022-02-04 NOTE — Progress Notes (Signed)
FPTS Brief Progress Note  S:Patient sitting up in bed on his phone when I came into the room. He endorses a chronic headache since the stroke. Denies worsening of pain. Does feel the Tramadol has helped. Denies any other questions or concerns   O: BP 121/81 (BP Location: Right Arm)   Pulse 75   Temp 98.2 F (36.8 C) (Oral)   Resp (!) 23   Ht 5\' 5"  (1.651 m)   Wt 70.3 kg   SpO2 97%   BMI 25.79 kg/m   General: alert, sitting in bed, NAD Resp: normal WOB on RA  CV: RRR Neuro: alert and oriented. no focal deficits    A/P: Continue current management per day team's note  - Orders reviewed. Labs for AM ordered, which was adjusted as needed.    , DO 02/04/2022, 1:32 AM PGY-2, [facility] Night Resident  Please page [phone] with questions.

## 2022-02-04 NOTE — Evaluation (Addendum)
Occupational Therapy Evaluation Patient Details Name: Carl Armstrong MRN: [id number] DOB: February 22, 1971 Today's Date: 02/04/2022   History of Present Illness 51 y.o. male presenting with acute onset L leg weakness. MRI demonstrates scattered infarcts in R MCA territory. PMH is significant for HTN, history of SAH 2017 ICA aneurysm x2 coiled   Clinical Impression   Pt reports independence at baseline with ADLs and functional mobility, pt currently mod I for ADLs, benefits from use of crutch during mobility as pt with LOB x2 during hallway ambulation, min A to recorrect. Pt reporting dull headache, however VSS on RA during session. Educated pt on use of BSC as shower seat for home, pt verbalized understanding. Pt presenting with impairments listed below, will follow acutely. Recommend d/c home with family assistance.      Recommendations for follow up therapy are one component of a multi-disciplinary discharge planning process, led by the attending physician.  Recommendations may be updated based on patient status, additional functional criteria and insurance authorization.   Follow Up Recommendations  No OT follow up    Assistance Recommended at Discharge Intermittent Supervision/Assistance  Patient can return home with the following A little help with walking and/or transfers;Help with stairs or ramp for entrance;Assist for transportation;Assistance with cooking/housework    Functional Status Assessment  Patient has had a recent decline in their functional status and demonstrates the ability to make significant improvements in function in a reasonable and predictable amount of time.  Equipment Recommendations  BSC/3in1 (as shower seat)    Recommendations for Other Services PT consult     Precautions / Restrictions Precautions Precautions: Fall Restrictions Weight Bearing Restrictions: No      Mobility Bed Mobility Overal bed mobility: Independent             General bed  mobility comments: no bed rail use    Transfers Overall transfer level: Modified independent Equipment used: Crutches               General transfer comment: no cues needed      Balance Overall balance assessment: Mild deficits observed, not formally tested                                         ADL either performed or assessed with clinical judgement   ADL Overall ADL's : Modified independent                                       General ADL Comments: able to complete standing grooming, LB dressing, and toilet transfer with mod I     Vision Baseline Vision/History: 1 Wears glasses Ability to See in Adequate Light: 0 Adequate Patient Visual Report: No change from baseline Vision Assessment?: No apparent visual deficits Additional Comments: visual assessment completed, pt able to track all quadrants, central and peripheral vision WNL     Perception     Praxis      Pertinent Vitals/Pain Pain Assessment Pain Assessment: No/denies pain     Hand Dominance     Extremity/Trunk Assessment Upper Extremity Assessment Upper Extremity Assessment: Overall WFL for tasks assessed   Lower Extremity Assessment Lower Extremity Assessment: Defer to PT evaluation   Cervical / Trunk Assessment Cervical / Trunk Assessment: Normal   Communication Communication Communication: No difficulties   Cognition Arousal/Alertness: Awake/alert  Behavior During Therapy: WFL for tasks assessed/performed Overall Cognitive Status: Within Functional Limits for tasks assessed                                 General Comments: pt scoring 2 on SBT indicative of normal cognition     General Comments  BP WNL    Exercises     Shoulder Instructions      Home Living Family/patient expects to be discharged to:: Private residence Living Arrangements: Spouse/significant other Available Help at Discharge: Family;Available  PRN/intermittently Type of Home: House Home Access: Stairs to enter [employer] of Steps: 2-3 Entrance Stairs-Rails: Right Home Layout: One level     Bathroom Shower/Tub: IT trainer: Standard     Home Equipment: None          Prior Functioning/Environment Prior Level of Function : Independent/Modified Independent             Mobility Comments: no AD use ADLs Comments: endorses driving and IADLs, not working        OT Problem List: Impaired balance (sitting and/or standing);Decreased activity tolerance;Decreased strength;Decreased safety awareness      OT Treatment/Interventions: Self-care/ADL training;Therapeutic exercise;Energy conservation;DME and/or AE instruction;Therapeutic activities;Patient/family education    OT Goals(Current goals can be found in the care plan section) Acute Rehab OT Goals Patient Stated Goal: to get better OT Goal Formulation: With patient Time For Goal Achievement: 02/18/22 Potential to Achieve Goals: Good  OT Frequency: Min 2X/week    Co-evaluation              AM-PAC OT "6 Clicks" Daily Activity     Outcome Measure Help from another person eating meals?: None Help from another person taking care of personal grooming?: None Help from another person toileting, which includes using toliet, bedpan, or urinal?: None Help from another person bathing (including washing, rinsing, drying)?: A Little Help from another person to put on and taking off regular upper body clothing?: None Help from another person to put on and taking off regular lower body clothing?: None 6 Click Score: 23   End of Session Equipment Utilized During Treatment: Gait belt Nurse Communication: Mobility status  Activity Tolerance: Patient tolerated treatment well Patient left: in bed;with call bell/phone within reach;with bed alarm set  OT Visit Diagnosis: Unsteadiness on feet (R26.81);Other abnormalities of gait  and mobility (R26.89);Muscle weakness (generalized) (M62.81)                Time: 7322-0254 OT Time Calculation (min): 22 min Charges:  OT General Charges $OT Visit: 1 Visit OT Evaluation $OT Eval Low Complexity: 1 Low  Alfonzo Beers, OTD, OTR/L Acute Rehab [phone]   Mayer Masker 02/04/2022, 12:59 PM

## 2022-02-04 NOTE — Progress Notes (Signed)
Physical Therapy Treatment and Discharge Patient Details Name: Carl Armstrong MRN: [id number] DOB: 20-May-1971 Today's Date: 02/04/2022   History of Present Illness 51 y.o. male presenting with acute onset L leg weakness. MRI demonstrates scattered infarcts in R MCA territory. 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 Able to progress to walking without crutch, however did have one instance of catching his toes. He was able to recover his balance independently. Issued elastic toe lifter and pt with improved foot clearance. He still had one instance of catching his toes with imbalance and independent recovery. Recommend using crutch for catching his balance if his toe does catch. Patient in agreement with plan and order for crutches placed.     Recommendations for follow up therapy are one component of a multi-disciplinary discharge planning process, led by the attending physician.  Recommendations may be updated based on patient status, additional functional criteria and insurance authorization.  Follow Up Recommendations  Outpatient PT     Assistance Recommended at Discharge PRN  Patient can return home with the following     Equipment Recommendations  Crutches (pt will use single crutch)    Recommendations for Other Services       Precautions / Restrictions Precautions Precautions: Fall Restrictions Weight Bearing Restrictions: No     Mobility  Bed Mobility Overal bed mobility: Independent             General bed mobility comments: no bed rail use    Transfers Overall transfer level: Modified independent Equipment used: Crutches, None               General transfer comment: no cues needed (1 crutch vs none)    Ambulation/Gait Ambulation/Gait assistance: Modified independent (Device/Increase time), Independent Gait Distance  (Feet): 300 Feet (with and without crutch; 100 ft with toe lifter attached to shoe) Assistive device:  (single crutch in RUE) Gait Pattern/deviations: Step-through pattern, Decreased dorsiflexion - left, Knee hyperextension - left Gait velocity: appropriately decr     General Gait Details: States his ankle feels stronger and noted improved DF when pt focuses on heelstrike. Also noted left knee hyperextension when pt focuses on heelstrike. Issued toe lifter and pt able to ambulate without device with mild DF/heelstrike and without severe knee hyperextension.   Stairs             Wheelchair Mobility    Modified Rankin (Stroke Patients Only) Modified Rankin (Stroke Patients Only) Pre-Morbid Rankin Score: No symptoms Modified Rankin: Slight disability     Balance Overall balance assessment: Mild deficits observed, not formally tested                                          Cognition Arousal/Alertness: Awake/alert Behavior During Therapy: WFL for tasks assessed/performed Overall Cognitive Status: Within Functional Limits for tasks assessed                                          Exercises      General Comments General comments (skin integrity, edema, etc.): BP WNL      Pertinent Vitals/Pain Pain Assessment Pain Assessment: No/denies pain    Home  Living Family/patient expects to be discharged to:: Private residence Living Arrangements: Spouse/significant other Available Help at Discharge: Family;Available PRN/intermittently Type of Home: House Home Access: Stairs to enter Entrance Stairs-Rails: Right Entrance Stairs-Number of Steps: 2-3   Home Layout: One level Home Equipment: None      Prior Function            PT Goals (current goals can now be found in the care plan section) Acute Rehab PT Goals Patient Stated Goal: get strength back PT Goal Formulation: With patient Time For Goal Achievement: 02/17/22 Potential  to Achieve Goals: Good Progress towards PT goals: Goals met/education completed, patient discharged from PT    Frequency    Min 4X/week      PT Plan Current plan remains appropriate    Co-evaluation              AM-PAC PT "6 Clicks" Mobility   Outcome Measure  Help needed turning from your back to your side while in a flat bed without using bedrails?: None Help needed moving from lying on your back to sitting on the side of a flat bed without using bedrails?: None Help needed moving to and from a bed to a chair (including a wheelchair)?: None Help needed standing up from a chair using your arms (e.g., wheelchair or bedside chair)?: None Help needed to walk in hospital room?: None Help needed climbing 3-5 steps with a railing? : None 6 Click Score: 24    End of Session   Activity Tolerance: Patient tolerated treatment well Patient left: in bed;with call bell/phone within reach Nurse Communication: Mobility status PT Visit Diagnosis: Other abnormalities of gait and mobility (R26.89)   PT Discharge Note  Patient is being discharged from PT services secondary to:  Goals met and no further therapy needs identified.  Please see latest Therapy Progress Note for current level of functioning and progress toward goals.  Progress and discharge plan and discussed with patient/caregiver and they  Agree  Time: 4718-5501 PT Time Calculation (min) (ACUTE ONLY): 18 min  Charges:  $Gait Training: 8-22 mins                      Arby Barrette, PT [place]  Pager [phone] Office [phone]    Rexanne Mano 02/04/2022, 3:52 PM

## 2022-02-04 NOTE — Progress Notes (Incomplete)
Family Medicine Teaching Service Daily Progress Note Intern Pager: [phone]  Patient name: Carl Armstrong Medical record number: [id number] Date of birth: 28-Jun-1971 Age: 51 y.o. Gender: male  Primary Care Provider: Patient, No Pcp Per (Inactive) Consultants: Neurology Code Status: Full code  Pt Overview and Major Events to Date:  5/27 admitted  Assessment and Plan:  Carl Armstrong is a 51 year old male presenting with acute stroke.  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   Stroke  Acute infarct of R cerebral convexity, MCA watershed Plan for diagnostic cerebral angiogram today with neurology given due to artifacts. Only used tramadol 1x yesterday -ASA 81 mg  -Plavix 3 weeks -PT/OT -cardiac monitoring -tramadol 50 mg q12h prn  History of SAH in 2017  ICA aneurysm s/p coiling x2 Cerebral angiogram today to ensure stents still open. -Neurology following, appreciate recommendations  HTN Pressures have been 100-141/62-91. -Monitor -Hold home antihypertensives  HLD -Atorvastatin 40 mg daily  Tobacco use -Nicotine patch as needed  History of alcohol withdrawal CIWA overnight all 0 -monitor -hydroxyzine PRN (was scoring on scale at some point for anxiety)  FEN/GI: NPO for angiogram PPx: Lovenox  Dispo: home possibly after angiogram today  Subjective:  No issues this morning  Objective: Temp:  [97.7 F (36.5 C)-99.3 F (37.4 C)] 98.6 F (37 C) (05/29 1655) Pulse Rate:  [65-88] 65 (05/29 1655) Resp:  [16-23] 20 (05/29 1655) BP: (100-141)/(62-95) 132/86 (05/29 1655) SpO2:  [97 %-100 %] 100 % (05/29 1655) Physical Exam: General: NAD, laying in bed Cardiovascular: RRR no m/r/g Respiratory: CTAB no w/r/c Abdomen: Nontender, sfot Extremities: No LE edema  Laboratory: Recent Labs  Lab 02/02/22 0552 02/03/22 0055  WBC 11.4* 10.3  HGB 13.0 13.3  HCT 40.3 41.9  PLT 357 397    Recent Labs  Lab 02/02/22 0552 02/03/22 0055 02/04/22 0109  NA 139 138 139  K 3.7 3.1* 3.9  CL 103 103 107  CO2 28 26 25   BUN 8 14 13   CREATININE 1.14 1.37* 1.56*  CALCIUM 8.6* 9.2 8.9  GLUCOSE 101* 122* 83    Imaging/Diagnostic Tests: No results found.   , MD 02/04/2022, 8:12 PM PGY-1, [facility] Family Medicine FPTS Intern pager: [phone], text pages welcome

## 2022-02-04 NOTE — Progress Notes (Signed)
Family Medicine Teaching Service Daily Progress Note Intern Pager: [phone]  Patient name: Carl Armstrong Medical record number: [id number] Date of birth: December 12, 1970 Age: 51 y.o. 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  PMH includes HTN, SAH 2017, ICA aneurysm x2 s/p coil placement.  Carl Armstrong is a 51 year old male presenting with acute stroke.  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   Stroke  Acute infarct of R cerebral convexity, MCA watershed Stable.  Participating in PT and OT.  Echo unremarkable.  Plan for diagnostic angiogram tomorrow with neurology.  LDL 123. - Neurology on board, appreciate care of this patient - Continue aspirin and Plavix - Continue cardiac monitoring - Continue PT/OT    History of SAH in 2017  8 ICA aneurysm x2 status post coiling subsequent PED placement Patient continues to have bilateral temporal headache, relieved by tramadol. -Neurology following, appreciate recommendations - Lower threshold for repeat imaging  Hypertension Pressures continue to be normotensive.  Continue holding home blood pressure medications.   Hyperlipidemia Continue atorvastatin 40 mg.   Tobacco use Continue cessation counseling.  Nicotine patch as needed.   History of alcohol withdrawal CIWA's overnight 1, 4, 5.  Attributed to anxiety and headache.  No Ativan required.  Continue to monitor.  FEN/GI: Heart healthy PPx: Lovenox Dispo:Home tomorrow. Barriers include completion of diagnostic angiogram with neurology 5/30.   Subjective:  Patient found at bedside with physical therapist.  Only complaint is a bilateral  temporal headache, relieved by tramadol.  Reports he is doing quite well.  He does not want to use a crutch for his left foot drop.  He is looking forward to going home.  Objective: Temp:  [97.7 F (36.5 C)-99.3 F (37.4 C)] 97.7 F (36.5 C) (05/29 0808) Pulse Rate:  [65-88] 79 (05/29 0808) Resp:  [18-23] 20 (05/29 0808) BP: (100-134)/(62-95) 100/62 (05/29 0808) SpO2:  [96 %-100 %] 98 % (05/29 9233) Physical Exam: General: Awake, alert, oriented, no acute distress Cardiovascular: Skin warm and well perfused Respiratory: Speaking in full sentences, no respiratory distress Neuro: Equal grip strength 5/5, BUE and BLE strength 5/5 and equal  Laboratory: Recent Labs  Lab 02/02/22 0552 02/03/22 0055  WBC 11.4* 10.3  HGB 13.0 13.3  HCT 40.3 41.9  PLT 357 397   Recent Labs  Lab 02/02/22 0552 02/03/22 0055 02/04/22 0109  NA 139 138 139  K 3.7 3.1* 3.9  CL 103 103 107  CO2 28 26 25   BUN 8 14 13   CREATININE 1.14 1.37* 1.56*  CALCIUM 8.6* 9.2 8.9  GLUCOSE 101* 122* 83   Imaging/Diagnostic Tests: None last 24 hours.   , MD 02/04/2022, 10:10 AM PGY-2, [facility] Family Medicine FPTS Intern pager: [phone], text pages welcome

## 2022-02-05 ENCOUNTER — Inpatient Hospital Stay (HOSPITAL_COMMUNITY)

## 2022-02-05 DIAGNOSIS — F17200 Nicotine dependence, unspecified, uncomplicated: Secondary | ICD-10-CM

## 2022-02-05 DIAGNOSIS — I639 Cerebral infarction, unspecified: Secondary | ICD-10-CM

## 2022-02-05 DIAGNOSIS — I63411 Cerebral infarction due to embolism of right middle cerebral artery: Secondary | ICD-10-CM

## 2022-02-05 HISTORY — PX: IR ANGIO VERTEBRAL SEL VERTEBRAL BILAT MOD SED: IMG5369

## 2022-02-05 HISTORY — PX: IR ANGIO INTRA EXTRACRAN SEL COM CAROTID INNOMINATE BILAT MOD SED: IMG5360

## 2022-02-05 HISTORY — PX: IR US GUIDE VASC ACCESS RIGHT: IMG2390

## 2022-02-05 LAB — PROTIME-INR
INR: 1.1 (ref 0.8–1.2)
Prothrombin Time: 13.9 seconds (ref 11.4–15.2)

## 2022-02-05 LAB — BASIC METABOLIC PANEL
Anion gap: 5 (ref 5–15)
BUN: 15 mg/dL (ref 6–20)
CO2: 23 mmol/L (ref 22–32)
Calcium: 9.2 mg/dL (ref 8.9–10.3)
Chloride: 107 mmol/L (ref 98–111)
Creatinine, Ser: 1.29 mg/dL — ABNORMAL HIGH (ref 0.61–1.24)
GFR, Estimated: >60 mL/min (ref >60)
Glucose, Bld: 87 mg/dL (ref 70–99)
Potassium: 3.9 mmol/L (ref 3.5–5.1)
Sodium: 135 mmol/L (ref 135–145)

## 2022-02-05 IMAGING — XA IR ANGIO INTRA EXTRACRAN SEL COM CAROTID INNOMINATE BILAT MOD SE
3 series · 7 of 7 positions shown · IV contrast (IODINE)
Comparison: MRI brain [DATE] and CTA head/neck [DATE].

CLINICAL DATA: Left-sided weakness leg greater than arm. Abnormal
MRI of the brain with foci of DWI restriction in the right
intracranial anterior circulation.

EXAM:
BILATERAL COMMON CAROTID AND INNOMINATE ANGIOGRAPHY
TECHNIQUE: Informed written consent was obtained from the patient after a
thorough discussion of the procedural risks, benefits and
alternatives. All questions were addressed. Maximal Sterile Barrier
Technique was utilized including caps, mask, sterile gowns, sterile
gloves, sterile drape, hand hygiene and skin antiseptic. A timeout
was performed prior to the initiation of the procedure.

[Series 1: ir (id) (id) · 2 of 2 slices shown]
[im 1/2]
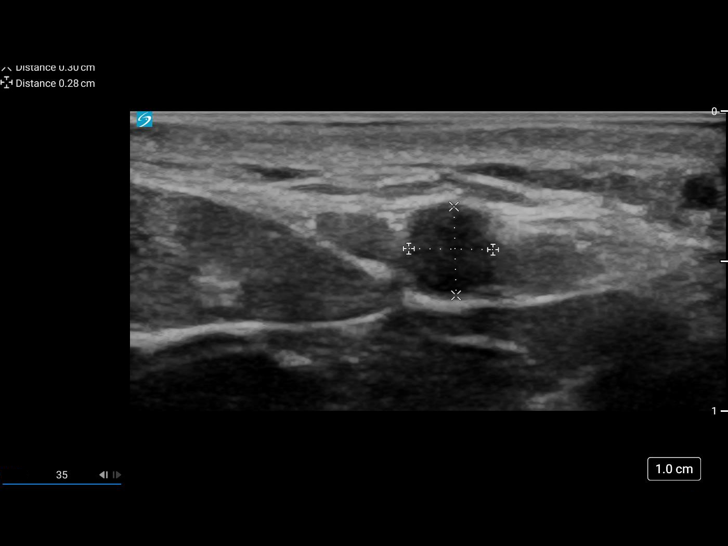
[im 2/2]
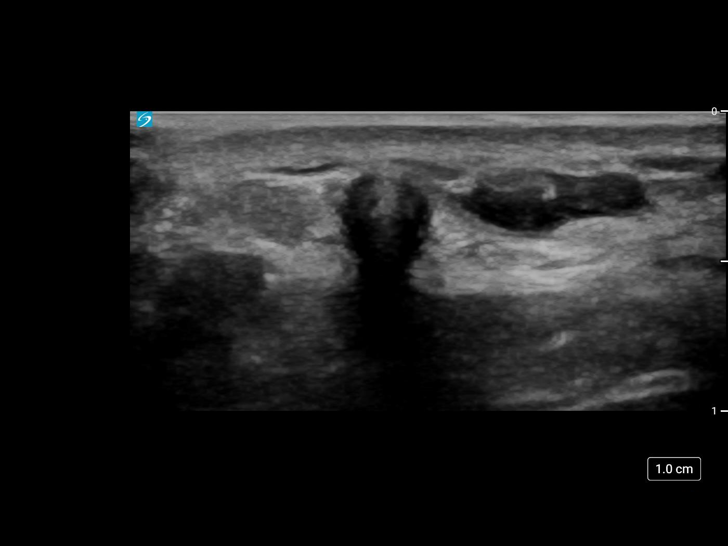

[Series 3: cerebral · 1 of 1 slices shown (1 of 2)]
[im 1/1]
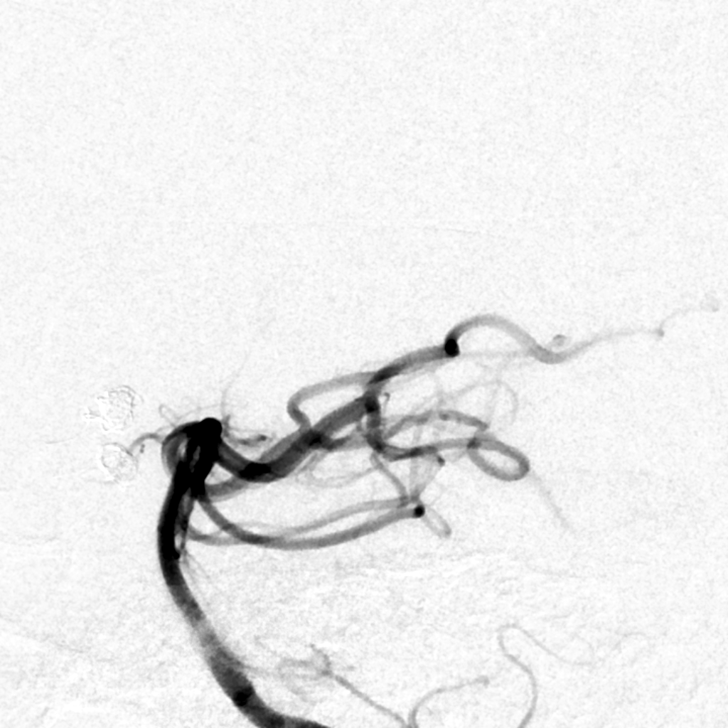

[Series 11: cerebral · 4 of 4 slices shown (2 of 2)]
[im 1/4]
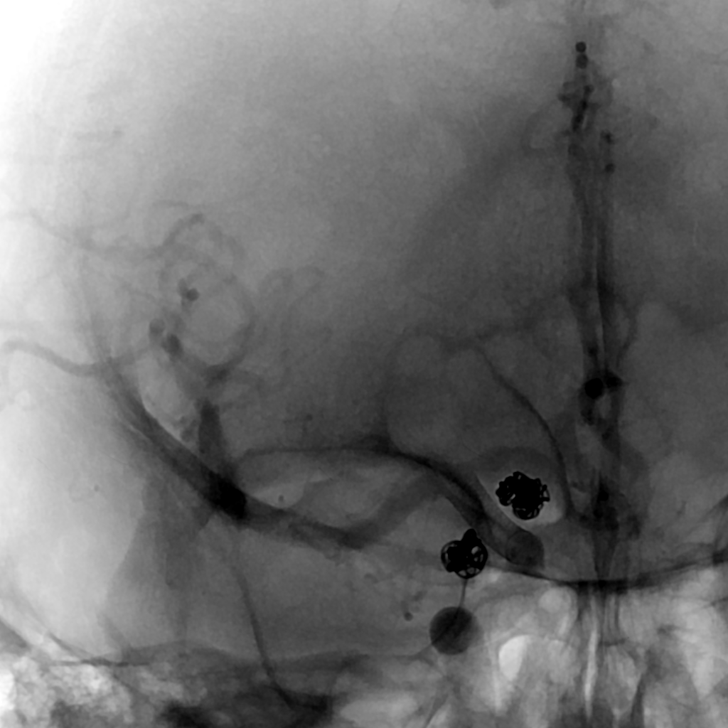
[im 2/4]
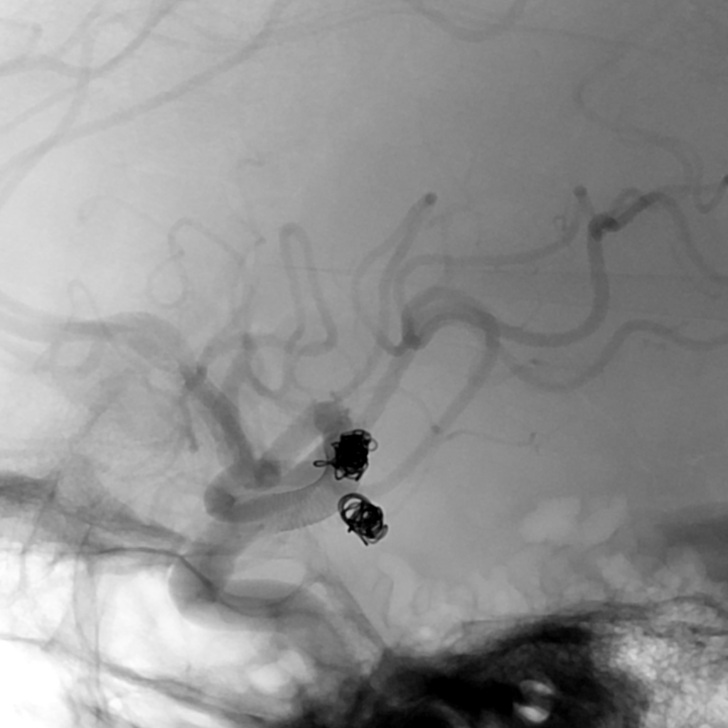
[im 3/4]
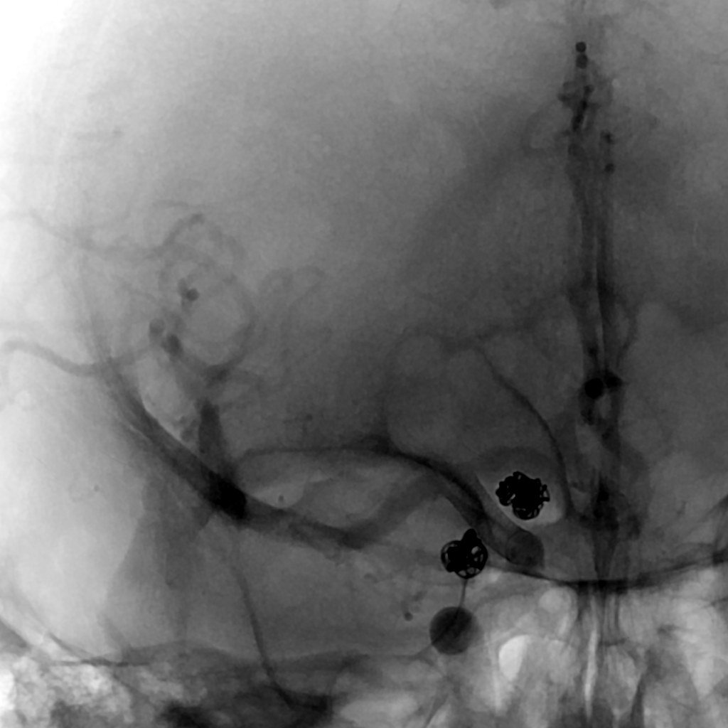
[im 4/4]
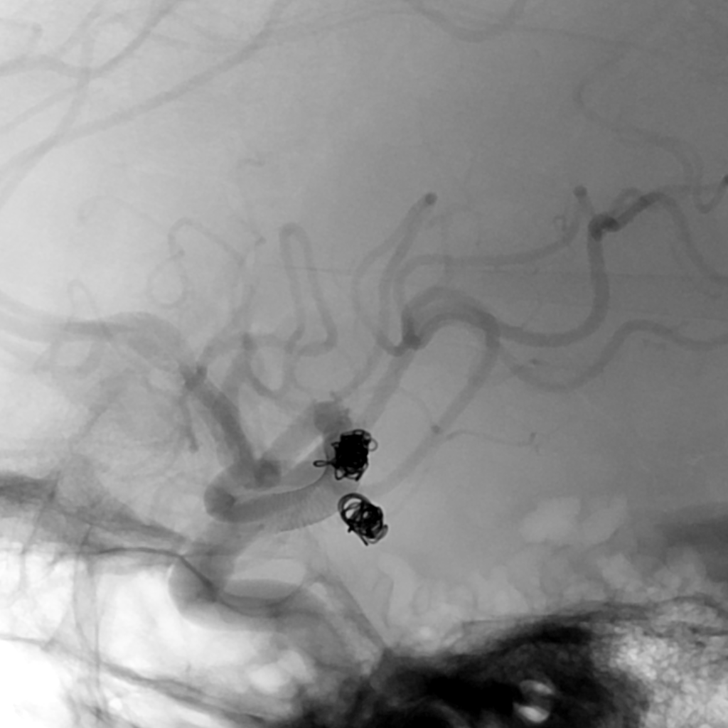

[7 of 7 positions shown; findings below may reference images not displayed]

MEDICATIONS:
Heparin [R0] units IV. None antibiotic was administered within 1
hour of the procedure.

ANESTHESIA/SEDATION:
Versed 1 mg IV; Fentanyl 25 mcg IV

Moderate Sedation Time:  44 minutes

The patient was continuously monitored during the procedure by the
interventional radiology nurse under my direct supervision.

CONTRAST:  Omnipaque 300 approximately 65 mL.

FLUOROSCOPY TIME:  Fluoroscopy Time: 8 minutes 24 seconds (848 mGy).

COMPLICATIONS:
None immediate.
The right forearm to the wrist was prepped and draped in the usual
manner.

The right radial artery was then identified with ultrasound and its
morphology documented in the radiology PACS system.

A dorsal palmar anastomosis was verified to be present. Using
ultrasound guidance, access into the right radial artery was
obtained over a 0.018 inch micro guidewire.

A [DATE] French radial sheath was then inserted. The micro guidewire,
and the obturator were removed. Good aspiration obtained from the
side port of the radial sheath. A cocktail of [R0] units of heparin,
and 200 mcg of nitroglycerin was then infused in diluted form
without event. A right radial arteriogram was performed.

Over a 0.035 inch Roadrunner guidewire, a 5 KERNS 2
diagnostic catheter was then advanced to the aortic arch region, and
selectively positioned in the right vertebral artery, the right
common carotid artery, the left common carotid artery and the left
vertebral artery.

A wrist band was then applied at the right radial access site for
hemostasis.
FINDINGS: The right vertebral artery origin is widely patent.

The vessel is seen to opacify to the cranial skull base. Wide
patency is seen of the right vertebrobasilar junction and the right
posterior-inferior cerebellar artery. Opacified portion of the
basilar artery, the posterior cerebral arteries, the superior
cerebellar arteries and the anterior-inferior cerebellar arteries
opacifies into the capillary and venous phases.

The left vertebral artery origin is widely patent.

The vessel is seen to opacify to the cranial skull base. Wide
patency is seen of the left vertebrobasilar junction and the left
posterior-inferior cerebellar artery.

The basilar artery, the posterior cerebral arteries, the superior
cerebellar arteries and the anterior-inferior cerebellar arteries
demonstrate patency into the capillary and venous phases.
Unopacified blood is seen in the basilar artery from the
contralateral vertebral artery.

The left common carotid arteriogram demonstrates the left external
carotid artery and its major branches to be widely patent.

The left internal carotid artery at the bulb to the cranial skull
base demonstrates wide patency. The petrous, the cavernous and the
supraclinoid segments are widely patent.

Incidentally, the left ophthalmic artery is seen to arise from the
inferomedial aspect of the caval cavernous/proximal cavernous right
ICA junction, a developmental variation.

The left middle cerebral artery opacifies into the capillary and
venous phases.

No left anterior cerebral artery A1 segment is noted.

Right common carotid arteriogram demonstrates the right external
carotid artery and its major branches to be widely patent.

The right internal carotid artery at the bulb to the cranial skull
base is widely patent.

There is a fine linear lucency noted in the posterior [DATE] to [DATE] of
the right internal carotid artery bulb, suspicious of a carotid web.

More distally, the right internal carotid artery is seen to opacify
widely to the cranial skull base.

The petrous, the cavernous and the supraclinoid segments demonstrate
wide patency.

The previously endovascularly coiled aneurysms in the supraclinoid
right ICA demonstrate obliteration without evidence of
recanalization or of coil compaction.

The previously positioned flow diverter extending from the
supraclinoid right ICA more proximally into the distal cavernous
segment demonstrates wide patency without evidence of intraluminal
filling defects or of stenosis.

The right middle cerebral artery and the right anterior cerebral
artery opacify into the capillary and venous phases.

Prompt filling via the anterior communicating artery of the left
anterior cerebral artery A2 segment and distally is evident.
IMPRESSION: No angiographic evidence of stenosis or of intraluminal filling
defects of the right internal carotid supraclinoid segments within
the previously positioned pipeline flow diverter.

Complete obliteration of the previously treated right ICA
supraclinoid aneurysms with primary coiling without coil compaction
or of recanalization.

Fine linear lucency in the right internal carotid artery bulb as
described above suspicious of a carotid web. Non opacification of
the left anterior cerebral artery A1 segment, which may represent
congenital atresia versus pathologic occlusion.

PLAN:
CTA of the head and neck/ultrasound of the carotids in 3 months.

## 2022-02-05 MED ORDER — HEPARIN SODIUM (PORCINE) 1000 UNIT/ML IJ SOLN
INTRAMUSCULAR | Status: AC
  Filled 2022-02-05: qty 10

## 2022-02-05 MED ORDER — FENTANYL CITRATE (PF) 100 MCG/2ML IJ SOLN
INTRAMUSCULAR | Status: DC
Start: 2022-02-05 — End: 2022-02-05
  Filled 2022-02-05: qty 2

## 2022-02-05 MED ORDER — ATORVASTATIN CALCIUM 40 MG PO TABS: 40.0000 mg | ORAL_TABLET | Freq: Every day | ORAL | 0 refills | Status: DC

## 2022-02-05 MED ORDER — LIDOCAINE HCL 1 % IJ SOLN
INTRAMUSCULAR | Status: AC
  Filled 2022-02-05: qty 20

## 2022-02-05 MED ORDER — VERAPAMIL HCL 2.5 MG/ML IV SOLN
INTRAVENOUS | Status: AC
  Filled 2022-02-05: qty 2

## 2022-02-05 MED ORDER — MIDAZOLAM HCL 2 MG/2ML IJ SOLN
INTRAMUSCULAR | Status: AC
  Filled 2022-02-05: qty 2

## 2022-02-05 MED ORDER — MIDAZOLAM HCL 2 MG/2ML IJ SOLN
INTRAMUSCULAR | Status: DC | PRN
  Administered 2022-02-05: 1 mg via INTRAVENOUS

## 2022-02-05 MED ORDER — IOHEXOL 300 MG/ML  SOLN
100.0000 mL | Freq: Once | INTRAMUSCULAR | Status: AC | PRN
  Administered 2022-02-05: 30 mL via INTRA_ARTERIAL

## 2022-02-05 MED ORDER — CLOPIDOGREL BISULFATE 75 MG PO TABS: 75.0000 mg | ORAL_TABLET | Freq: Every day | ORAL | 0 refills | Status: DC

## 2022-02-05 MED ORDER — SODIUM CHLORIDE (PF) 0.9 % IJ SOLN
INTRAVENOUS | Status: DC | PRN
  Administered 2022-02-05 (×2): 200 ug via INTRA_ARTERIAL

## 2022-02-05 MED ORDER — VERAPAMIL HCL 2.5 MG/ML IV SOLN
INTRA_ARTERIAL | Status: DC | PRN
  Administered 2022-02-05: 6 mL via INTRA_ARTERIAL

## 2022-02-05 MED ORDER — CLOPIDOGREL BISULFATE 75 MG PO TABS
75.0000 mg | ORAL_TABLET | Freq: Every day | ORAL | 0 refills | Status: DC
Start: 2022-02-05 — End: 2022-02-05

## 2022-02-05 MED ORDER — FENTANYL CITRATE (PF) 100 MCG/2ML IJ SOLN
INTRAMUSCULAR | Status: DC | PRN
  Administered 2022-02-05: 25 ug via INTRAVENOUS

## 2022-02-05 MED ORDER — ASPIRIN 81 MG PO TBEC: 81.0000 mg | DELAYED_RELEASE_TABLET | Freq: Every day | ORAL | 0 refills | Status: AC

## 2022-02-05 MED ORDER — SODIUM CHLORIDE 0.9 % IV SOLN: INTRAVENOUS | Status: DC

## 2022-02-05 MED ORDER — NITROGLYCERIN 1 MG/10 ML FOR IR/CATH LAB
INTRA_ARTERIAL | Status: AC
  Filled 2022-02-05: qty 10

## 2022-02-05 NOTE — Procedures (Signed)
INR. Four-vessel cerebral arteriogram.  Right radial approach. Findings. 1.  Wide patency of the previously positioned pipeline flow diverter in the supraclinoid right ICA. 2.  Right ICA supraclinoid aneurysms remain occluded without evidence of compaction of the coils or of recanalization. 3.  Nonopacification of the left anterior cerebral A1 segment. 4.  Horizontal linear hyperdensity in the posterior half of the right ICA bulb suspicious of a web.  S.Shandee Jergens MD

## 2022-02-05 NOTE — Progress Notes (Signed)
Discharge teaching complete. Meds, diet, activity, follow up appointments, TR band site care reviewed and all questions answered. Copy of instructions given to patient and prescriptions sent to pharmacy. Patient discharged home via wheelchair with daughter.

## 2022-02-05 NOTE — Progress Notes (Signed)
Patient back to room from IR.

## 2022-02-05 NOTE — Progress Notes (Addendum)
STROKE TEAM PROGRESS NOTE   SUBJECTIVE (INTERVAL HISTORY) Wife is at the bedside.  No acute event overnight, had cerebral angiogram today showed patent pipeline stent and no residue aneurysm. Questionable right carotid web but negative on CTA and carotid doppler, likely artifact. TCD bubble study negative. Will do outpt TEE as inpt TEE not available until Friday and pt would not stay for Friday.    OBJECTIVE Temp:  [97.7 F (36.5 C)-98.8 F (37.1 C)] 97.7 F (36.5 C) (05/30 0852) Pulse Rate:  [54-85] 68 (05/30 1318) Cardiac Rhythm: Normal sinus rhythm (05/30 1130) Resp:  [11-21] 14 (05/30 1230) BP: (95-138)/(55-110) 127/96 (05/30 1318) SpO2:  [96 %-100 %] 99 % (05/30 1318)  No results for input(s): GLUCAP in the last 168 hours. Recent Labs  Lab 02/02/22 0552 02/03/22 0055 02/04/22 0109 02/05/22 0640  NA 139 138 139 135  K 3.7 3.1* 3.9 3.9  CL 103 103 107 107  CO2 GLUCOSE 101* 122* 83 87  BUN CREATININE 1.14 1.37* 1.56* 1.29*  CALCIUM 8.6* 9.2 8.9 9.2   No results for input(s): AST, ALT, ALKPHOS, BILITOT, PROT, ALBUMIN in the last 168 hours. Recent Labs  Lab 02/02/22 0552 02/03/22 0055  WBC 11.4* 10.3  NEUTROABS 8.7*  --   HGB 13.0 13.3  HCT 40.3 41.9  MCV 73.9* 73.8*  PLT 357 397   No results for input(s): CKTOTAL, CKMB, CKMBINDEX, TROPONINI in the last 168 hours. Recent Labs    02/05/22 0640  LABPROT 13.9  INR 1.1   No results for input(s): COLORURINE, LABSPEC, PHURINE, GLUCOSEU, HGBUR, BILIRUBINUR, KETONESUR, PROTEINUR, UROBILINOGEN, NITRITE, LEUKOCYTESUR in the last 72 hours.  Invalid input(s): APPERANCEUR     Component Value Date/Time   CHOL 193 02/03/2022 0055   TRIG 120 02/03/2022 0055   HDL 46 02/03/2022 0055   CHOLHDL 4.2 02/03/2022 0055   VLDL 24 02/03/2022 0055   LDLCALC 123 (H) 02/03/2022 0055   Lab Results  Component Value Date   HGBA1C 6.1 (H) 02/03/2022      Component Value Date/Time   LABOPIA NONE DETECTED  02/03/2022 2100   COCAINSCRNUR NONE DETECTED 02/03/2022 2100   LABBENZ NONE DETECTED 02/03/2022 2100   AMPHETMU NONE DETECTED 02/03/2022 2100   THCU NONE DETECTED 02/03/2022 2100   LABBARB NONE DETECTED 02/03/2022 2100    No results for input(s): ETH in the last 168 hours.  I have personally reviewed the radiological images below and agree with the radiology interpretations.  CT ANGIO HEAD NECK W WO CM  Result Date: 02/02/2022 CLINICAL DATA:  Stroke follow-up EXAM: CT ANGIOGRAPHY HEAD AND NECK TECHNIQUE: Multidetector CT imaging of the head and neck was performed using the standard protocol during bolus administration of intravenous contrast. Multiplanar CT image reconstructions and MIPs were obtained to evaluate the vascular anatomy. Carotid stenosis measurements (when applicable) are obtained utilizing NASCET criteria, using the distal internal carotid diameter as the denominator. RADIATION DOSE REDUCTION: This exam was performed according to the departmental dose-optimization program which includes automated exposure control, adjustment of the mA and/or kV according to patient size and/or use of iterative reconstruction technique. CONTRAST:  75mL OMNIPAQUE IOHEXOL 350 MG/ML SOLN COMPARISON:  Preceding brain MRI FINDINGS: CT HEAD FINDINGS Brain: Acute cortical infarcts along the right cerebral convexity, underestimated relative to prior MRI. No acute hemorrhage, hydrocephalus, or masslike finding. Vascular: See below Skull: Negative Sinuses: Clear Orbits: Negative Review of the MIP images confirms the above findings CTA NECK  FINDINGS Aortic arch: Limited coverage is negative.  Three vessel branching. Right carotid system: Vessels are smooth and diffusely patent without atheromatous change. Left carotid system: Vessels are smooth and diffusely patent without atheromatous change. Vertebral arteries: No proximal subclavian stenosis. Both vertebral arteries are smoothly contoured and widely patent to the  dura. Skeleton: No acute finding. Other neck: No evidence of mass or inflammation Upper chest: Biapical centrilobular emphysema. Review of the MIP images confirms the above findings CTA HEAD FINDINGS Anterior circulation: Stent assisted right ICA aneurysm coiling with 2 coil masses. There is unavoidable artifact at the level of the stent and coil masses. Visible flow within the stent and no downstream diminished flow. Dominant right A1 segment. No evidence of branch occlusion, beading, or untreated aneurysm Posterior circulation: The vertebral and basilar arteries are smoothly contoured and widely patent. No branch occlusion, beading, or aneurysm. Venous sinuses: Diffusely opacified. There is asymmetric enhancement in the left cavernous sinus. No secondary signs of carotid cavernous fistula or acute venous thrombosis on the right, presumably chronic and possibly from prior perianeurysmal inflammation. Anatomic variants: As above Review of the MIP images confirms the above findings IMPRESSION: 1. 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 The stent is patent; detection of in stent stenosis/embolic source is limited due to unavoidable artifact. No stenosis or embolic source seen in the more proximal arterial circulation. 2. Asymmetric lack of enhancement at the right cavernous sinus presumably chronic in this clinical setting. 3.  Emphysema (ICD10-J43.9). Electronically Signed   By: Tiburcio Pea M.D.   On: 02/02/2022 12:11   MR BRAIN WO CONTRAST  Result Date: 02/02/2022 CLINICAL DATA:  Left-sided weakness since last night EXAM: MRI HEAD WITHOUT CONTRAST TECHNIQUE: Multiplanar, multiecho pulse sequences of the brain and surrounding structures were obtained without intravenous contrast. COMPARISON:  None Available. FINDINGS: Brain: Small acute cortical infarcts scattered along the right superior frontal, superior parietal, and upper occipital cortex, right MCA periphery/watershed distribution. Mild  petechial hemorrhage associated with some of the infarcts. No hematoma, hydrocephalus, collection, or masslike finding. Vascular: Major flow voids are preserved Skull and upper cervical spine: No focal marrow lesion. Sinuses/Orbits: Negative IMPRESSION: Scattered acute infarcts along the right cerebral convexity, MCA watershed distribution. Electronically Signed   By: Tiburcio Pea M.D.   On: 02/02/2022 10:28   ECHOCARDIOGRAM COMPLETE  Result Date: 02/03/2022    ECHOCARDIOGRAM REPORT   Patient Name:   CAIN FITZHENRY Date of Exam: 02/03/2022 Medical Rec #:  [id number]    Height:       65.0 in Accession #:    [id number]   Weight:       155.0 lb Date of Birth:  27-Jan-1971    BSA:          1.775 m Patient Age:    50 years     BP:           100/58 mmHg Patient Gender: M            HR:           73 bpm. Exam Location:  Inpatient Procedure: 2D Echo, Cardiac Doppler and Color Doppler Indications:    Stroke  History:        Patient has no prior history of Echocardiogram examinations.                 Risk Factors:Hypertension and Current Smoker.  Sonographer:    Ross Ludwig RDCS (AE) Referring Phys: [id number] Estevan Ryder MCINTYRE IMPRESSIONS  1. Left ventricular ejection  fraction, by estimation, is 60 to 65%. The left ventricle has normal function. The left ventricle has no regional wall motion abnormalities. Left ventricular diastolic parameters were normal.  2. Right ventricular systolic function is low normal. The right ventricular size is mildly enlarged. Tricuspid regurgitation signal is inadequate for assessing PA pressure.  3. The mitral valve is abnormal. Mild mitral valve regurgitation.  4. The aortic valve is tricuspid. Aortic valve regurgitation is not visualized.  5. The inferior vena cava is normal in size with greater than 50% respiratory variability, suggesting right atrial pressure of 3 mmHg. Comparison(s): No prior Echocardiogram. FINDINGS  Left Ventricle: Left ventricular ejection fraction, by estimation, is 60  to 65%. The left ventricle has normal function. The left ventricle has no regional wall motion abnormalities. The left ventricular internal cavity size was normal in size. There is  no left ventricular hypertrophy. Left ventricular diastolic parameters were normal. Right Ventricle: The right ventricular size is mildly enlarged. No increase in right ventricular wall thickness. Right ventricular systolic function is low normal. Tricuspid regurgitation signal is inadequate for assessing PA pressure. Left Atrium: Left atrial size was normal in size. Right Atrium: Right atrial size was normal in size. Pericardium: There is no evidence of pericardial effusion. Mitral Valve: The mitral valve is abnormal. There is mild thickening of the posterior and anterior mitral valve leaflet(s). Mild mitral valve regurgitation. Tricuspid Valve: The tricuspid valve is grossly normal. Tricuspid valve regurgitation is trivial. Aortic Valve: The aortic valve is tricuspid. Aortic valve regurgitation is not visualized. Aortic valve mean gradient measures 2.0 mmHg. Aortic valve peak gradient measures 4.2 mmHg. Aortic valve area, by VTI measures 3.14 cm. Pulmonic Valve: The pulmonic valve was grossly normal. Pulmonic valve regurgitation is mild. Aorta: The aortic root and ascending aorta are structurally normal, with no evidence of dilitation. Venous: The inferior vena cava is normal in size with greater than 50% respiratory variability, suggesting right atrial pressure of 3 mmHg. IAS/Shunts: No atrial level shunt detected by color flow Doppler.  LEFT VENTRICLE PLAX 2D LVIDd:         4.40 cm   Diastology LVIDs:         2.80 cm   LV e' medial:    9.46 cm/s LV PW:         1.00 cm   LV E/e' medial:  7.4 LV IVS:        1.00 cm   LV e' lateral:   10.90 cm/s LVOT diam:     2.30 cm   LV E/e' lateral: 6.4 LV SV:         69 LV SV Index:   39 LVOT Area:     4.15 cm  RIGHT VENTRICLE            IVC RV Basal diam:  2.70 cm    IVC diam: 1.30 cm RV S  prime:     9.25 cm/s TAPSE (M-mode): 2.5 cm LEFT ATRIUM             Index        RIGHT ATRIUM           Index LA diam:        2.70 cm 1.52 cm/m   RA Area:     14.50 cm LA Vol (A2C):   28.7 ml 16.17 ml/m  RA Volume:   33.50 ml  18.87 ml/m LA Vol (A4C):   47.7 ml 26.87 ml/m LA Biplane Vol: 37.9 ml 21.35 ml/m  AORTIC VALVE AV Area (Vmax):    3.43 cm AV Area (Vmean):   3.37 cm AV Area (VTI):     3.14 cm AV Vmax:           103.00 cm/s AV Vmean:          65.400 cm/s AV VTI:            0.218 m AV Peak Grad:      4.2 mmHg AV Mean Grad:      2.0 mmHg LVOT Vmax:         85.00 cm/s LVOT Vmean:        53.000 cm/s LVOT VTI:          0.165 m LVOT/AV VTI ratio: 0.76  AORTA Ao Root diam: 3.20 cm Ao Asc diam:  2.90 cm MITRAL VALVE MV Area (PHT): 3.12 cm    SHUNTS MV Decel Time: 243 msec    Systemic VTI:  0.16 m MV E velocity: 69.70 cm/s  Systemic Diam: 2.30 cm MV A velocity: 43.20 cm/s MV E/A ratio:  1.61 Zoila Shutter MD Electronically signed by Zoila Shutter MD Signature Date/Time: 02/03/2022/12:00:58 PM    Final      PHYSICAL EXAM  Temp:  [97.7 F (36.5 C)-98.8 F (37.1 C)] 97.7 F (36.5 C) (05/30 0852) Pulse Rate:  [54-85] 68 (05/30 1318) Resp:  [11-21] 14 (05/30 1230) BP: (95-138)/(55-110) 127/96 (05/30 1318) SpO2:  [96 %-100 %] 99 % (05/30 1318)  General - Well nourished, well developed, in no apparent distress.  Ophthalmologic - fundi not visualized due to noncooperation.  Cardiovascular - Regular rhythm and rate.  Mental Status -  Level of arousal and orientation to time, place, and person were intact. Language including expression, naming, repetition, comprehension was assessed and found intact. Attention span and concentration were normal. Fund of Knowledge was assessed and was intact.  Cranial Nerves II - XII - II - Visual field intact OU. III, IV, VI - Extraocular movements intact. V - Facial sensation intact bilaterally. VII - Facial movement intact bilaterally. VIII - Hearing &  vestibular intact bilaterally. X - Palate elevates symmetrically. XI - Chin turning & shoulder shrug intact bilaterally. XII - Tongue protrusion intact.  Motor Strength - The patient's strength was normal in all extremities and pronator drift was absent except left foot drop with DF 1/5, PF 3/5.  Bulk was normal and fasciculations were absent.   Motor Tone - Muscle tone was assessed at the neck and appendages and was normal.  Reflexes - The patient's reflexes were symmetrical in all extremities and he had no pathological reflexes.  Sensory - Light touch, temperature/pinprick were assessed and were symmetrical except decreased light touch sensation at left LE.    Coordination - The patient had normal movements in the hands and right foot with no ataxia or dysmetria.  However, left LTS ataxic. Tremor was absent.  Gait and Station - deferred.   ASSESSMENT/PLAN Mr. Estel Scholze is a 51 y.o. male with history of hypertension, smoker, alcohol abuse, cerebral aneurysm status post coil embolization and pipeline stent, TIA admitted for left-sided weakness. No tPA given due to outside window.    Stroke:  right MCA and ACA scattered small infarcts, etiology unclear MRI right ICA and MCA scattered infarcts CT head and neck status post right intracranial ICA aneurysm coiling and pipeline stent, which is patent.  Detection of in-stent stenosis/embolic source is limited due to artifact. Cerebral angiogram patent pipeline stent and no residue aneurysm 2D  Echo EF 60 to 65% TCD bubble study negative for PFO Carotid doppler on the right unremarkable, no carotid web Plan for outpt TEE Recommend 30 day cardiac event monitoring to rule out afib LDL 123 HgbA1c 6.1 Hypercoagulable work up pending UDS negative Lovenox for VTE prophylaxis aspirin 81 mg daily prior to admission, now on aspirin 81 mg daily and clopidogrel 75 mg daily DAPT for 3 weeks and then plavix alone. Patient counseled to be compliant  with his antithrombotic medications Ongoing aggressive stroke risk factor management Therapy recommendations: Outpatient PT Disposition: Pending  Cerebral aneurysm 10/2015 postcoital headache, [facility], found to have diffuse SAH on CT.  CTA head and neck showed right A1 and right terminal ICA aneurysms.  Status post coil embolization. 05/2016 follow-up cerebral angiogram in [facility], developed headache and blurry vision, confusion and aphasia post angiogram.  CT negative, CTA head and neck negative.  Discharged on aspirin 81. 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  Short lasting.  [place] stroke work-up negative.  Hypertension Stable Long term BP goal normotensive  Hyperlipidemia Home meds: None LDL 123, goal < 70 Now on Lipitor 40 Continue statin at discharge  Tobacco abuse Current smoker Smoking cessation counseling provided Pt is willing to quit  Other Stroke Risk Factors ETOH use, educated on limitation of alcohol use  Other Active Problems Mild AKI, creatinine 1.14-1.37  Hospital day # 1  Neurology will sign off. Please call with questions. Pt will follow up with stroke clinic [facility] in about 4 weeks. Thanks for the consult.   Marvel PlanJindong Remiel Corti, MD PhD Stroke Neurology 02/05/2022 2:50 PM    To contact Stroke Continuity provider, please refer to [email or web address]. After hours, contact General Neurology

## 2022-02-05 NOTE — Plan of Care (Signed)
  Problem: Health Behavior/Discharge Planning: Goal: Ability to manage health-related needs will improve Outcome: Progressing   Problem: Clinical Measurements: Goal: Ability to maintain clinical measurements within normal limits will improve Outcome: Progressing Goal: Will remain free from infection Outcome: Progressing Goal: Diagnostic test results will improve Outcome: Progressing Goal: Respiratory complications will improve Outcome: Progressing   Problem: Activity: Goal: Risk for activity intolerance will decrease Outcome: Progressing   

## 2022-02-05 NOTE — Progress Notes (Signed)
FPTS Brief Progress Note  S:Patient sleeping comfortably    O: BP (!) 127/91 (BP Location: Right Arm)   Pulse 60   Temp 97.8 F (36.6 C) (Oral)   Resp 20   Ht 5\' 5"  (1.651 m)   Wt 70.3 kg   SpO2 100%   BMI 25.79 kg/m     A/P: Continue current management.  - Orders reviewed. Labs for AM ordered, which was adjusted as needed.    , DO 02/05/2022, 12:52 AM PGY-2, [facility] Night Resident  Please page [phone] with questions.

## 2022-02-05 NOTE — Sedation Documentation (Signed)
Attempted to call report to Sam, Charity fundraiser. She informed me that 5W does not recover TR Bands. Pt will need to be transferred to appropriate unit.

## 2022-02-05 NOTE — Progress Notes (Addendum)
Spoke with cardiology master-plan for TEE-Friday 6/2

## 2022-02-05 NOTE — Discharge Summary (Addendum)
Family Medicine Teaching [facility] Discharge Summary  Patient name: Carl Armstrong Medical record number: [id number] Date of birth: July 12, 1971 Age: 51 y.o. Gender: male Date of Admission: 02/02/2022  Date of Discharge: 02/05/2021 Admitting Physician: Levin Erp, MD  Primary Care Provider: Patient, No Pcp Per (Inactive) Consultants: Neurology  Indication for Hospitalization: Stroke  Discharge Diagnoses/Problem List:  Principal Problem:   Stroke [facility]) Active Problems:   Primary hypertension   Prediabetes   TIA (transient ischemic attack)   Disposition: Home with outpatient PT  Discharge Condition: Stable  Discharge Exam:  General: NAD, laying in bed, alert and oriented Cardiovascular: RRR no m/r/g Respiratory: CTAB no w/r/c Abdomen: Nontender, soft, bowel sounds normoactive Extremities: No LE edema, able to plantar and dorsiflex on left side now Neuro: decreased sensation of LLE as compared to right, no other focal deficits  Brief Hospital Course:  Carl Armstrong is a 51 y.o. male presenting with acute stroke . 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 CT angio head and neck showed stent in right intracranial ICA aneurysm with coiling and patent stent in place. Echo showed EF 60-65% and mild MV regurgitation. TSH was WNL at 1.286, lipid panel showed elevated LDL to 123, A1c was elevated in prediabetic range at 6/1%. PT saw patient and recommended outpatient PT. Neurology saw patient and recommended plavix for 3 weeks and aspirin 81 mg daily. Initiated atorvastatin 40 mg. Four vessel cerebral arteriogram showed right ICA suspicious for a web. Neurology recommended additional  testing to understand stroke origin. TCD bubble study which is negative for PFO. TEE ordered and will be done outpatient and hypercoagulable panel ordered by neurology and pending.  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    Issues for Follow Up:  Ensure continuing DAPT for 3 weeks and then plavix alone Continue atorvastatin 40 mg daily Continue evaluating for readiness to quit smoking cigarettes Ensure pt gets outpatient TEE 6/2 Ensure pt gets 20 day cardiac monitor to rule out A fib Follow up in stroke clinic in 4 weeks as an outpatient  Follow up antibody labs Held losartan (did not require during hospital) restart as appropriate  Significant Procedures:   Significant Labs and Imaging:  Recent Labs  Lab 02/02/22 0552 02/03/22 0055  WBC 11.4* 10.3  HGB 13.0 13.3  HCT 40.3 41.9  PLT 357 397   Recent Labs  Lab 02/02/22 0552 02/03/22 0055 02/04/22 0109 02/05/22 0640  NA 139 138 139 135  K 3.7 3.1* 3.9 3.9  CL 103 103 107 107  CO2 GLUCOSE 101* 122* 83 87  BUN CREATININE 1.14 1.37* 1.56* 1.29*  CALCIUM 8.6* 9.2 8.9 9.2   CT ANGIO HEAD NECK W WO CM  Result Date: 02/02/2022 CLINICAL DATA:  Stroke follow-up EXAM: CT ANGIOGRAPHY HEAD AND NECK TECHNIQUE: Multidetector CT imaging of the head and neck was performed using the standard protocol during bolus administration of intravenous contrast. Multiplanar CT image reconstructions and MIPs were obtained to evaluate the vascular anatomy. Carotid stenosis  measurements (when applicable) are obtained utilizing NASCET criteria, using the distal internal carotid diameter as the denominator. RADIATION DOSE REDUCTION: This exam was performed according to the departmental dose-optimization program which includes automated exposure control, adjustment of  the mA and/or kV according to patient size and/or use of iterative reconstruction technique. CONTRAST:  22mL OMNIPAQUE IOHEXOL 350 MG/ML SOLN COMPARISON:  Preceding brain MRI FINDINGS: CT HEAD FINDINGS Brain: Acute cortical infarcts along the right cerebral convexity, underestimated relative to prior MRI. No acute hemorrhage, hydrocephalus, or masslike finding. Vascular: See below Skull: Negative Sinuses: Clear Orbits: Negative Review of the MIP images confirms the above findings CTA NECK FINDINGS Aortic arch: Limited coverage is negative.  Three vessel branching. Right carotid system: Vessels are smooth and diffusely patent without atheromatous change. Left carotid system: Vessels are smooth and diffusely patent without atheromatous change. Vertebral arteries: No proximal subclavian stenosis. Both vertebral arteries are smoothly contoured and widely patent to the dura. Skeleton: No acute finding. Other neck: No evidence of mass or inflammation Upper chest: Biapical centrilobular emphysema. Review of the MIP images confirms the above findings CTA HEAD FINDINGS Anterior circulation: Stent assisted right ICA aneurysm coiling with 2 coil masses. There is unavoidable artifact at the level of the stent and coil masses. Visible flow within the stent and no downstream diminished flow. Dominant right A1 segment. No evidence of branch occlusion, beading, or untreated aneurysm Posterior circulation: The vertebral and basilar arteries are smoothly contoured and widely patent. No branch occlusion, beading, or aneurysm. Venous sinuses: Diffusely opacified. There is asymmetric enhancement in the left cavernous sinus. No secondary signs of carotid cavernous fistula or acute venous thrombosis on the right, presumably chronic and possibly from prior perianeurysmal inflammation. Anatomic variants: As above Review of the MIP images confirms the above findings IMPRESSION: 1. 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 The  stent is patent; detection of in stent stenosis/embolic source is limited due to unavoidable artifact. No stenosis or embolic source seen in the more proximal arterial circulation. 2. Asymmetric lack of enhancement at the right cavernous sinus presumably chronic in this clinical setting. 3.  Emphysema (ICD10-J43.9). Electronically Signed   By: Tiburcio Pea M.D.   On: 02/02/2022 12:11   MR BRAIN WO CONTRAST  Result Date: 02/02/2022 CLINICAL DATA:  Left-sided weakness since last night EXAM: MRI HEAD WITHOUT CONTRAST TECHNIQUE: Multiplanar, multiecho pulse sequences of the brain and surrounding structures were obtained without intravenous contrast. COMPARISON:  None Available. FINDINGS: Brain: Small acute cortical infarcts scattered along the right superior frontal, superior parietal, and upper occipital cortex, right MCA periphery/watershed distribution. Mild petechial hemorrhage associated with some of the infarcts. No hematoma, hydrocephalus, collection, or masslike finding. Vascular: Major flow voids are preserved Skull and upper cervical spine: No focal marrow lesion. Sinuses/Orbits: Negative IMPRESSION: Scattered acute infarcts along the right cerebral convexity, MCA watershed distribution. Electronically Signed   By: Tiburcio Pea M.D.   On: 02/02/2022 10:28   ECHOCARDIOGRAM COMPLETE  Result Date: 02/03/2022    ECHOCARDIOGRAM REPORT   Patient Name:   Carl Armstrong Date of Exam: 02/03/2022 Medical Rec #:  [id number]    Height:       65.0 in Accession #:    [id number]   Weight:       155.0 lb Date of Birth:  December 23, 1970    BSA:          1.775 m Patient Age:    50 years     BP:  100/58 mmHg Patient Gender: M            HR:           73 bpm. Exam Location:  Inpatient Procedure: 2D Echo, Cardiac Doppler and Color Doppler Indications:    Stroke  History:        Patient has no prior history of Echocardiogram examinations.                 Risk Factors:Hypertension and Current Smoker.  Sonographer:     Ross LudwigArthur Guy RDCS (AE) Referring Phys: [id number] Estevan RyderBRITTANY J MCINTYRE IMPRESSIONS  1. Left ventricular ejection fraction, by estimation, is 60 to 65%. The left ventricle has normal function. The left ventricle has no regional wall motion abnormalities. Left ventricular diastolic parameters were normal.  2. Right ventricular systolic function is low normal. The right ventricular size is mildly enlarged. Tricuspid regurgitation signal is inadequate for assessing PA pressure.  3. The mitral valve is abnormal. Mild mitral valve regurgitation.  4. The aortic valve is tricuspid. Aortic valve regurgitation is not visualized.  5. The inferior vena cava is normal in size with greater than 50% respiratory variability, suggesting right atrial pressure of 3 mmHg. Comparison(s): No prior Echocardiogram. FINDINGS  Left Ventricle: Left ventricular ejection fraction, by estimation, is 60 to 65%. The left ventricle has normal function. The left ventricle has no regional wall motion abnormalities. The left ventricular internal cavity size was normal in size. There is  no left ventricular hypertrophy. Left ventricular diastolic parameters were normal. Right Ventricle: The right ventricular size is mildly enlarged. No increase in right ventricular wall thickness. Right ventricular systolic function is low normal. Tricuspid regurgitation signal is inadequate for assessing PA pressure. Left Atrium: Left atrial size was normal in size. Right Atrium: Right atrial size was normal in size. Pericardium: There is no evidence of pericardial effusion. Mitral Valve: The mitral valve is abnormal. There is mild thickening of the posterior and anterior mitral valve leaflet(s). Mild mitral valve regurgitation. Tricuspid Valve: The tricuspid valve is grossly normal. Tricuspid valve regurgitation is trivial. Aortic Valve: The aortic valve is tricuspid. Aortic valve regurgitation is not visualized. Aortic valve mean gradient measures 2.0 mmHg. Aortic valve peak  gradient measures 4.2 mmHg. Aortic valve area, by VTI measures 3.14 cm. Pulmonic Valve: The pulmonic valve was grossly normal. Pulmonic valve regurgitation is mild. Aorta: The aortic root and ascending aorta are structurally normal, with no evidence of dilitation. Venous: The inferior vena cava is normal in size with greater than 50% respiratory variability, suggesting right atrial pressure of 3 mmHg. IAS/Shunts: No atrial level shunt detected by color flow Doppler.  LEFT VENTRICLE PLAX 2D LVIDd:         4.40 cm   Diastology LVIDs:         2.80 cm   LV e' medial:    9.46 cm/s LV PW:         1.00 cm   LV E/e' medial:  7.4 LV IVS:        1.00 cm   LV e' lateral:   10.90 cm/s LVOT diam:     2.30 cm   LV E/e' lateral: 6.4 LV SV:         69 LV SV Index:   39 LVOT Area:     4.15 cm  RIGHT VENTRICLE            IVC RV Basal diam:  2.70 cm    IVC diam: 1.30 cm RV S  prime:     9.25 cm/s TAPSE (M-mode): 2.5 cm LEFT ATRIUM             Index        RIGHT ATRIUM           Index LA diam:        2.70 cm 1.52 cm/m   RA Area:     14.50 cm LA Vol (A2C):   28.7 ml 16.17 ml/m  RA Volume:   33.50 ml  18.87 ml/m LA Vol (A4C):   47.7 ml 26.87 ml/m LA Biplane Vol: 37.9 ml 21.35 ml/m  AORTIC VALVE AV Area (Vmax):    3.43 cm AV Area (Vmean):   3.37 cm AV Area (VTI):     3.14 cm AV Vmax:           103.00 cm/s AV Vmean:          65.400 cm/s AV VTI:            0.218 m AV Peak Grad:      4.2 mmHg AV Mean Grad:      2.0 mmHg LVOT Vmax:         85.00 cm/s LVOT Vmean:        53.000 cm/s LVOT VTI:          0.165 m LVOT/AV VTI ratio: 0.76  AORTA Ao Root diam: 3.20 cm Ao Asc diam:  2.90 cm MITRAL VALVE MV Area (PHT): 3.12 cm    SHUNTS MV Decel Time: 243 msec    Systemic VTI:  0.16 m MV E velocity: 69.70 cm/s  Systemic Diam: 2.30 cm MV A velocity: 43.20 cm/s MV E/A ratio:  1.61 Zoila Shutter MD Electronically signed by Zoila Shutter MD Signature Date/Time: 02/03/2022/12:00:58 PM    Final    VAS US CAROTID  Result Date: 02/05/2022 Carotid  Arterial Duplex Study Patient Name:  Carl Armstrong  Date of Exam:   02/05/2022 Medical Rec #: [id number]     Accession #:    [id number] Date of Birth: 17-May-1971     Patient Gender: M Patient Age:   86 years Exam Location:  [facility] Procedure:      VAS US CAROTID Referring Phys: Marvel Plan --------------------------------------------------------------------------------  Indications:   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 Risk Factors:  Hypertension. Other Factors: History of SAH in 2017 due right ICA aneurysms s/p coiling. Performing Technologist: Marilynne Halsted RDMS, RVT  Examination Guidelines: A complete evaluation includes B-mode imaging, spectral Doppler, color Doppler, and power Doppler as needed of all accessible portions of each vessel. Bilateral testing is considered an integral part of a complete examination. Limited examinations for reoccurring indications may be performed as noted.  Right Carotid Findings: +----------+--------+--------+--------+------------------+--------+           PSV cm/sEDV cm/sStenosisPlaque DescriptionComments +----------+--------+--------+--------+------------------+--------+ CCA Prox  89      26                                         +----------+--------+--------+--------+------------------+--------+ CCA Distal74      19                                         +----------+--------+--------+--------+------------------+--------+  ICA Prox  74      31                                         +----------+--------+--------+--------+------------------+--------+ ICA Mid   83      22                                         +----------+--------+--------+--------+------------------+--------+ ICA Distal96      40                                         +----------+--------+--------+--------+------------------+--------+ ECA       70      21                                          +----------+--------+--------+--------+------------------+--------+ +----------+--------+-------+----------------+-------------------+           PSV cm/sEDV cmsDescribe        Arm Pressure (mmHG) +----------+--------+-------+----------------+-------------------+ [id number]            Multiphasic, WNL                    +----------+--------+-------+----------------+-------------------+ +---------+--------+--+--------+--+---------+ VertebralPSV cm/s41EDV cm/s16Antegrade +---------+--------+--+--------+--+---------+   Summary: Right Carotid: The extracranial vessels were near-normal with only minimal wall                thickening or plaque.  *See table(s) above for measurements and observations.     Preliminary      Results/Tests Pending at Time of Discharge: ANA. Cardiolipin, homcysteine, beta 2 glycoprotein, lupus anticoagulant  Discharge Medications:  Allergies as of 02/05/2022   No Known Allergies      Medication List     STOP taking these medications    losartan 25 MG tablet Commonly known as: COZAAR       TAKE these medications    aspirin EC 81 MG tablet Take 1 tablet (81 mg total) by mouth daily for 17 days. Swallow whole.   atorvastatin 40 MG tablet Commonly known as: LIPITOR Take 1 tablet (40 mg total) by mouth daily.   clopidogrel 75 MG tablet Commonly known as: PLAVIX Take 1 tablet (75 mg total) by mouth daily.   mirtazapine 15 MG tablet Commonly known as: REMERON Take 15 mg by mouth at bedtime.               Durable Medical Equipment  (From admission, onward)           Start     Ordered   02/04/22 1508  For home use only DME Crutches  Once       Comments: Pt is 5'5"   02/04/22 1508            Discharge Instructions: Please refer to Patient Instructions section of EMR for full details.  Patient was counseled important signs and symptoms that should prompt return to medical care, changes in medications, dietary instructions,  activity restrictions, and follow up appointments.   Follow-Up Appointments:  Follow-up Information     [facility] Follow up.   Contact information: [address] [place] [address] [phone]        [facility] Follow up.   Specialty: Rehabilitation Why: referral has been placed electronically for you, you can call to expedite scheduling Contact information: [address] [id number] mc [address] [place] [address] [phone]        Levin Erp, MD. Go on 02/18/2022.   Specialty: Family Medicine Why: At 1:45 pm. Please arrive by 1:30 pm. This is your hospital follow up appointment at the family medicine clinic. It will be with Dr. Laroy Apple, who cared for you while you were in the hospital. If this day and time does not work well for you, please call the office directly and reschedule. Contact information: [address] [place] [address] [phone]         Micki Riley, MD Follow up.   Specialties: Neurology, Radiology Contact information: [address] [place] [address] [phone]                 Levin Erp, MD 02/05/2022, 4:15 PM PGY-1, [facility] Family Medicine

## 2022-02-06 LAB — LUPUS ANTICOAGULANT PANEL
DRVVT: 37.3 s (ref 0.0–47.0)
PTT Lupus Anticoagulant: 35 s (ref 0.0–43.5)

## 2022-02-06 LAB — BETA-2-GLYCOPROTEIN I ABS, IGG/M/A
Beta-2 Glyco I IgG: <9 GPI IgG units (ref 0–20)
Beta-2-Glycoprotein I IgA: <9 GPI IgA units (ref 0–25)
Beta-2-Glycoprotein I IgM: <9 GPI IgM units (ref 0–32)

## 2022-02-06 LAB — ANA W/REFLEX IF POSITIVE: Anti Nuclear Antibody (ANA): NEGATIVE

## 2022-02-06 LAB — CARDIOLIPIN ANTIBODIES, IGG, IGM, IGA
Anticardiolipin IgA: <9 APL U/mL (ref 0–11)
Anticardiolipin IgG: <9 GPL U/mL (ref 0–14)
Anticardiolipin IgM: <9 MPL U/mL (ref 0–12)

## 2022-02-06 LAB — HOMOCYSTEINE: Homocysteine: 10.6 umol/L (ref 0.0–14.5)

## 2022-02-07 ENCOUNTER — Telehealth: Payer: Self-pay | Admitting: Home Health

## 2022-02-07 NOTE — Anesthesia Preprocedure Evaluation (Addendum)
Anesthesia Evaluation  Patient identified by MRN, date of birth, ID band Patient awake    Reviewed: Allergy & Precautions, NPO status , Patient's Chart, lab work & pertinent test results  Airway Mallampati: II  TM Distance: >3 FB Neck ROM: Full    Dental  (+) Partial Upper   Pulmonary neg pulmonary ROS, Current Smoker,    Pulmonary exam normal breath sounds clear to auscultation       Cardiovascular hypertension, Normal cardiovascular exam Rhythm:Regular Rate:Normal  Normal sinus rhythm Rightward axis T wave abnormality, consider inferior ischemia Abnormal ECG When compared with ECG of 21-Mar-2020 11:58, inferior T waves are more prominently inverted Confirmed by Croitoru, Mihai [phone]) on 02/05/2022 2:32:15 PM   Neuro/Psych H/o aneurysm s/p coiling TIACVA (on plavix) negative psych ROS   GI/Hepatic negative GI ROS, Neg liver ROS,   Endo/Other  negative endocrine ROS  Renal/GU negative Renal ROS  negative genitourinary   Musculoskeletal negative musculoskeletal ROS (+)   Abdominal   Peds negative pediatric ROS (+)  Hematology negative hematology ROS (+)   Anesthesia Other Findings   Reproductive/Obstetrics negative OB ROS                           Anesthesia Physical Anesthesia Plan  ASA: 3  Anesthesia Plan: MAC   Post-op Pain Management:    Induction: Intravenous  PONV Risk Score and Plan: Propofol infusion, TIVA and Treatment may vary due to age or medical condition  Airway Management Planned: Natural Airway, Nasal Cannula and Simple Face Mask  Additional Equipment: None  Intra-op Plan:   Post-operative Plan:   Informed Consent: I have reviewed the patients History and Physical, chart, labs and discussed the procedure including the risks, benefits and alternatives for the proposed anesthesia with the patient or authorized representative who has indicated his/her  understanding and acceptance.     Dental advisory given  Plan Discussed with: CRNA and Anesthesiologist  Anesthesia Plan Comments:        Anesthesia Quick Evaluation

## 2022-02-07 NOTE — Telephone Encounter (Signed)
    Shared Decision Making/Informed Consent   The risks [esophageal damage, perforation (1:10,000 risk), bleeding, pharyngeal hematoma as well as other potential complications associated with conscious sedation including aspiration, arrhythmia, respiratory failure and death], benefits (treatment guidance and diagnostic support) and alternatives of a transesophageal echocardiogram were discussed in detail with Carl Armstrong and he is willing to proceed.    Patient agreed with TEE, advised the patient remain NPO after midnight, arrive [facility] at 7am. Order placed.

## 2022-02-08 ENCOUNTER — Encounter (HOSPITAL_COMMUNITY): Payer: Self-pay | Admitting: Cardiovascular Disease

## 2022-02-08 ENCOUNTER — Encounter (HOSPITAL_COMMUNITY): Payer: Self-pay

## 2022-02-08 ENCOUNTER — Other Ambulatory Visit: Payer: Self-pay

## 2022-02-08 ENCOUNTER — Observation Stay (HOSPITAL_COMMUNITY)
Admission: EM | Admit: 2022-02-08 | Discharge: 2022-02-09 | Disposition: A | Discharge status: Home / Self Care | Payer: BLUE CROSS/BLUE SHIELD | Attending: Family Medicine | Admitting: Family Medicine

## 2022-02-08 ENCOUNTER — Encounter (HOSPITAL_COMMUNITY)
Admission: RE | Disposition: A | Discharge status: Home / Self Care | Payer: Self-pay | Source: Home / Self Care | Attending: Cardiovascular Disease

## 2022-02-08 ENCOUNTER — Ambulatory Visit (HOSPITAL_BASED_OUTPATIENT_CLINIC_OR_DEPARTMENT_OTHER)
Admission: RE | Admit: 2022-02-08 | Discharge: 2022-02-08 | Disposition: A | Discharge status: Home / Self Care | Payer: BLUE CROSS/BLUE SHIELD | Source: Ambulatory Visit | Attending: Home Health | Admitting: Home Health

## 2022-02-08 ENCOUNTER — Ambulatory Visit (HOSPITAL_COMMUNITY)
Admission: RE | Admit: 2022-02-08 | Discharge: 2022-02-08 | Disposition: A | Discharge status: Home / Self Care | Payer: BLUE CROSS/BLUE SHIELD | Source: Home / Self Care | Attending: Cardiovascular Disease | Admitting: Cardiovascular Disease

## 2022-02-08 ENCOUNTER — Emergency Department (HOSPITAL_COMMUNITY): Payer: BLUE CROSS/BLUE SHIELD

## 2022-02-08 ENCOUNTER — Ambulatory Visit (HOSPITAL_COMMUNITY): Payer: BLUE CROSS/BLUE SHIELD | Admitting: Anesthesiology

## 2022-02-08 DIAGNOSIS — R7303 Prediabetes: Secondary | ICD-10-CM | POA: Insufficient documentation

## 2022-02-08 DIAGNOSIS — Z8673 Personal history of transient ischemic attack (TIA), and cerebral infarction without residual deficits: Secondary | ICD-10-CM | POA: Insufficient documentation

## 2022-02-08 DIAGNOSIS — F17210 Nicotine dependence, cigarettes, uncomplicated: Secondary | ICD-10-CM | POA: Insufficient documentation

## 2022-02-08 DIAGNOSIS — I10 Essential (primary) hypertension: Secondary | ICD-10-CM | POA: Diagnosis not present

## 2022-02-08 DIAGNOSIS — E782 Mixed hyperlipidemia: Secondary | ICD-10-CM

## 2022-02-08 DIAGNOSIS — N179 Acute kidney failure, unspecified: Secondary | ICD-10-CM | POA: Insufficient documentation

## 2022-02-08 DIAGNOSIS — Z7982 Long term (current) use of aspirin: Secondary | ICD-10-CM | POA: Insufficient documentation

## 2022-02-08 DIAGNOSIS — G459 Transient cerebral ischemic attack, unspecified: Secondary | ICD-10-CM | POA: Diagnosis not present

## 2022-02-08 DIAGNOSIS — I63511 Cerebral infarction due to unspecified occlusion or stenosis of right middle cerebral artery: Secondary | ICD-10-CM

## 2022-02-08 DIAGNOSIS — Z720 Tobacco use: Secondary | ICD-10-CM

## 2022-02-08 DIAGNOSIS — Z79899 Other long term (current) drug therapy: Secondary | ICD-10-CM | POA: Insufficient documentation

## 2022-02-08 DIAGNOSIS — I639 Cerebral infarction, unspecified: Principal | ICD-10-CM | POA: Insufficient documentation

## 2022-02-08 DIAGNOSIS — Z7902 Long term (current) use of antithrombotics/antiplatelets: Secondary | ICD-10-CM | POA: Diagnosis not present

## 2022-02-08 DIAGNOSIS — I63411 Cerebral infarction due to embolism of right middle cerebral artery: Secondary | ICD-10-CM

## 2022-02-08 DIAGNOSIS — R200 Anesthesia of skin: Secondary | ICD-10-CM

## 2022-02-08 DIAGNOSIS — Z20822 Contact with and (suspected) exposure to covid-19: Secondary | ICD-10-CM | POA: Diagnosis not present

## 2022-02-08 DIAGNOSIS — F17200 Nicotine dependence, unspecified, uncomplicated: Secondary | ICD-10-CM | POA: Insufficient documentation

## 2022-02-08 HISTORY — PX: BUBBLE STUDY: SHX6837

## 2022-02-08 HISTORY — PX: TEE WITHOUT CARDIOVERSION: SHX5443

## 2022-02-08 LAB — COMPREHENSIVE METABOLIC PANEL
ALT: 26 U/L (ref 0–44)
AST: 29 U/L (ref 15–41)
Albumin: 4.2 g/dL (ref 3.5–5.0)
Alkaline Phosphatase: 73 U/L (ref 38–126)
Anion gap: 10 (ref 5–15)
BUN: 15 mg/dL (ref 6–20)
CO2: 23 mmol/L (ref 22–32)
Calcium: 9.1 mg/dL (ref 8.9–10.3)
Chloride: 106 mmol/L (ref 98–111)
Creatinine, Ser: 1.37 mg/dL — ABNORMAL HIGH (ref 0.61–1.24)
GFR, Estimated: >60 mL/min (ref >60)
Glucose, Bld: 98 mg/dL (ref 70–99)
Potassium: 3.7 mmol/L (ref 3.5–5.1)
Sodium: 139 mmol/L (ref 135–145)
Total Bilirubin: 0.5 mg/dL (ref 0.3–1.2)
Total Protein: 7 g/dL (ref 6.5–8.1)

## 2022-02-08 LAB — DIFFERENTIAL
Abs Immature Granulocytes: 0.02 10*3/uL (ref 0.00–0.07)
Basophils Absolute: 0 10*3/uL (ref 0.0–0.1)
Basophils Relative: 0 %
Eosinophils Absolute: 0.1 10*3/uL (ref 0.0–0.5)
Eosinophils Relative: 1 %
Immature Granulocytes: 0 %
Lymphocytes Relative: 31 %
Lymphs Abs: 2.3 10*3/uL (ref 0.7–4.0)
Monocytes Absolute: 0.6 10*3/uL (ref 0.1–1.0)
Monocytes Relative: 8 %
Neutro Abs: 4.5 10*3/uL (ref 1.7–7.7)
Neutrophils Relative %: 60 %

## 2022-02-08 LAB — URINALYSIS, ROUTINE W REFLEX MICROSCOPIC
Bilirubin Urine: NEGATIVE
Glucose, UA: NEGATIVE mg/dL
Hgb urine dipstick: NEGATIVE
Ketones, ur: NEGATIVE mg/dL
Leukocytes,Ua: NEGATIVE
Nitrite: NEGATIVE
Protein, ur: NEGATIVE mg/dL
Specific Gravity, Urine: 1.038 — ABNORMAL HIGH (ref 1.005–1.030)
pH: 5 (ref 5.0–8.0)

## 2022-02-08 LAB — I-STAT CHEM 8, ED
BUN: 20 mg/dL (ref 6–20)
Calcium, Ion: 0.91 mmol/L — ABNORMAL LOW (ref 1.15–1.40)
Chloride: 105 mmol/L (ref 98–111)
Creatinine, Ser: 1.4 mg/dL — ABNORMAL HIGH (ref 0.61–1.24)
Glucose, Bld: 94 mg/dL (ref 70–99)
HCT: 46 % (ref 39.0–52.0)
Hemoglobin: 15.6 g/dL (ref 13.0–17.0)
Potassium: 3.9 mmol/L (ref 3.5–5.1)
Sodium: 138 mmol/L (ref 135–145)
TCO2: 23 mmol/L (ref 22–32)

## 2022-02-08 LAB — RAPID URINE DRUG SCREEN, HOSP PERFORMED
Amphetamines: NOT DETECTED
Barbiturates: NOT DETECTED
Benzodiazepines: NOT DETECTED
Cocaine: NOT DETECTED
Opiates: NOT DETECTED
Tetrahydrocannabinol: NOT DETECTED

## 2022-02-08 LAB — CBG MONITORING, ED: Glucose-Capillary: 107 mg/dL — ABNORMAL HIGH (ref 70–99)

## 2022-02-08 LAB — RESP PANEL BY RT-PCR (FLU A&B, COVID) ARPGX2
Influenza A by PCR: NEGATIVE
Influenza B by PCR: NEGATIVE
SARS Coronavirus 2 by RT PCR: NEGATIVE

## 2022-02-08 LAB — PROTIME-INR
INR: 1 (ref 0.8–1.2)
Prothrombin Time: 13 seconds (ref 11.4–15.2)

## 2022-02-08 LAB — CBC
HCT: 43.3 % (ref 39.0–52.0)
Hemoglobin: 13.8 g/dL (ref 13.0–17.0)
MCH: 24 pg — ABNORMAL LOW (ref 26.0–34.0)
MCHC: 31.9 g/dL (ref 30.0–36.0)
MCV: 75.2 fL — ABNORMAL LOW (ref 80.0–100.0)
Platelets: 324 10*3/uL (ref 150–400)
RBC: 5.76 MIL/uL (ref 4.22–5.81)
RDW: 14.2 % (ref 11.5–15.5)
WBC: 7.6 10*3/uL (ref 4.0–10.5)
nRBC: 0 % (ref 0.0–0.2)

## 2022-02-08 LAB — ETHANOL: Alcohol, Ethyl (B): <10 mg/dL (ref <10)

## 2022-02-08 LAB — APTT: aPTT: 30 seconds (ref 24–36)

## 2022-02-08 IMAGING — CT CT ANGIO HEAD-NECK (W OR W/O PERF)
3 of 6 series · 9 of 33 positions shown · IV contrast (omnipaque)
Comparison: MR head and CT/CTA [DATE]

CLINICAL DATA: Code stroke. Recent stroke last week, new left
facial numbness starting at 4 p.m.

EXAM:
CT ANGIOGRAPHY HEAD AND NECK
TECHNIQUE: Multidetector CT imaging of the head and neck was performed using
the standard protocol during bolus administration of intravenous
contrast. Multiplanar CT image reconstructions and MIPs were
obtained to evaluate the vascular anatomy. Carotid stenosis
measurements (when applicable) are obtained utilizing NASCET
criteria, using the distal internal carotid diameter as the
denominator.
RADIATION DOSE REDUCTION: This exam was performed according to the
departmental dose-optimization program which includes automated
exposure control, adjustment of the mA and/or kV according to
patient size and/or use of iterative reconstruction technique.
CONTRAST:  50 cc Omnipaque 350

[Series 5: cta neck/head · axial · 0.47mm/px · z∈[+1307,+1423]mm · 2 of 175 slices shown]
[im 59/175  soft-tissue]
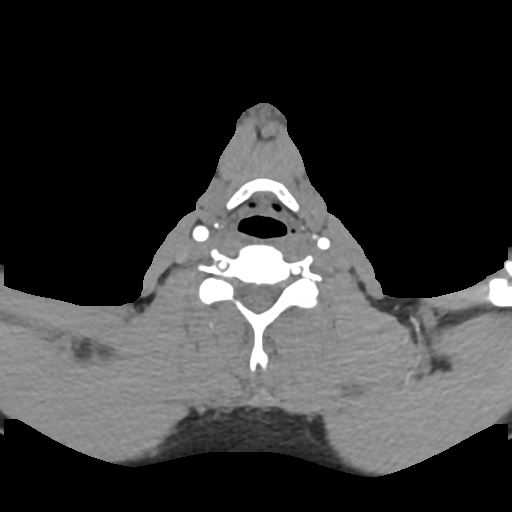
[im 117/175  soft-tissue]
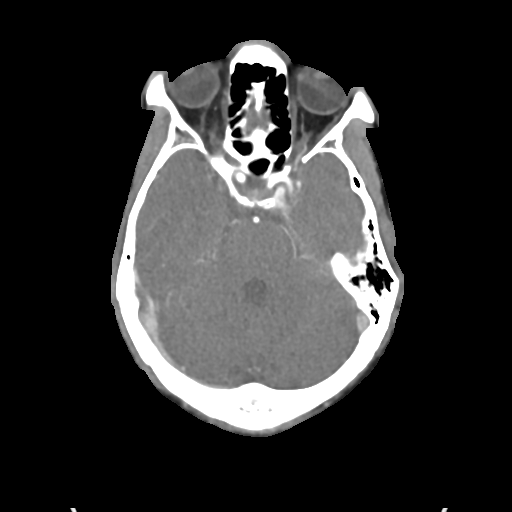

[Series 7: ax thins · axial · 0.39mm/px · z∈[+1241,+1490]mm · 6 of 349 slices shown]
[im 50/349  soft-tissue]
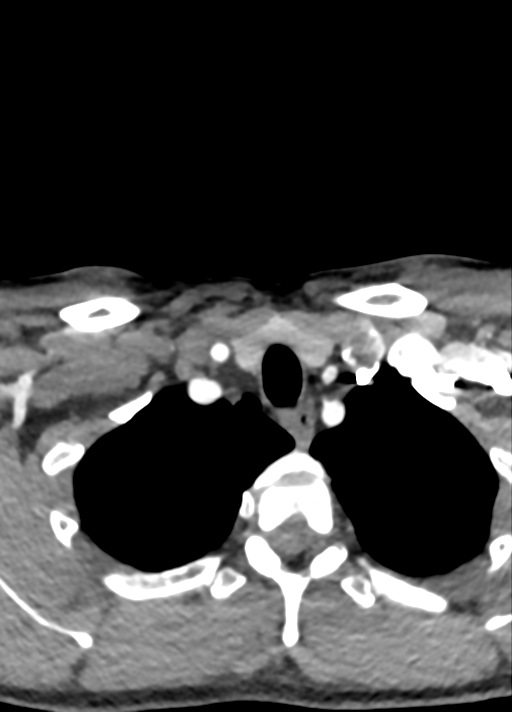
[im 100/349  bone]
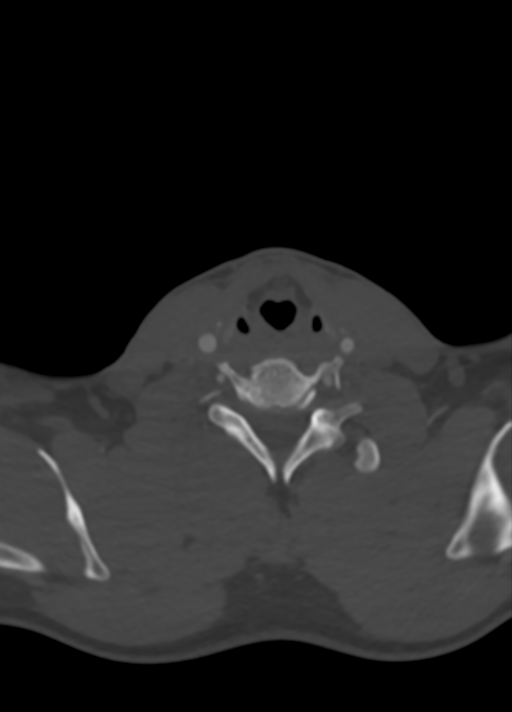
[im 150/349  soft-tissue]
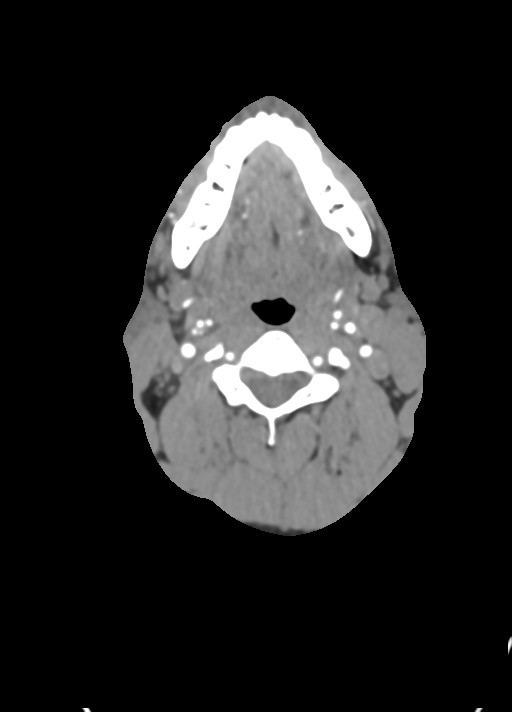
[im 199/349  bone]
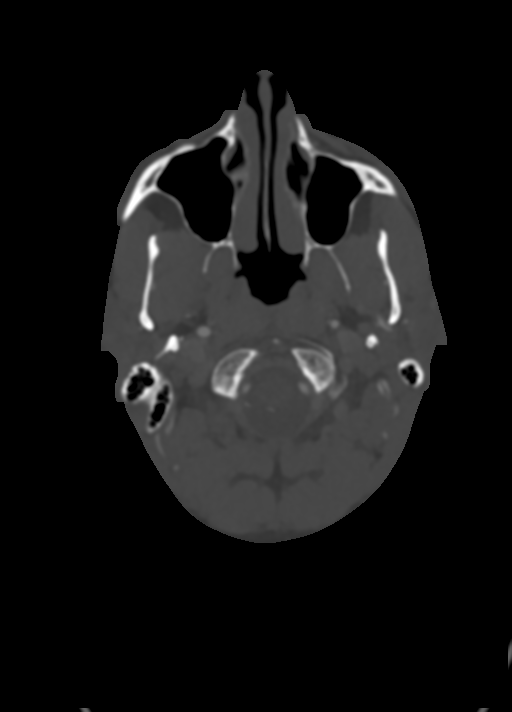
[im 249/349  soft-tissue]
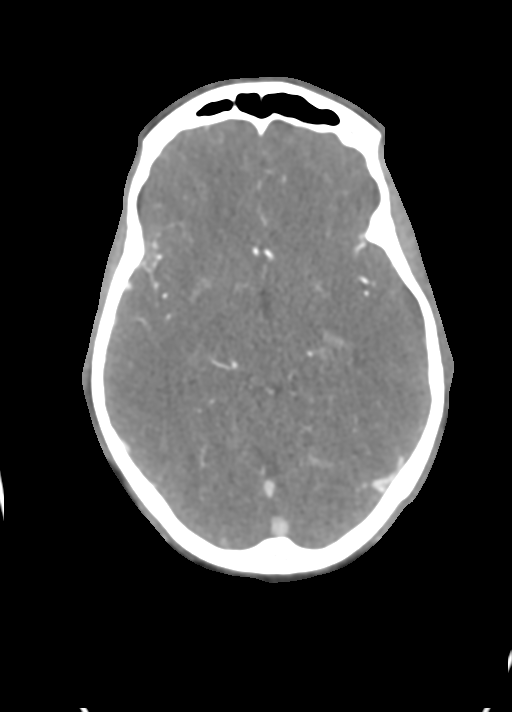
[im 299/349  bone]
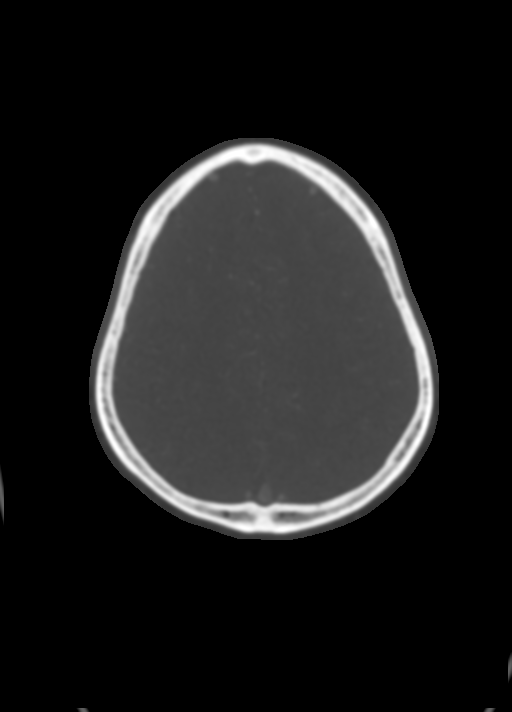

[Series 9: sag thins · sagittal · 0.53mm/px · 1 of 201 slices shown]
[im 101/201  soft-tissue]
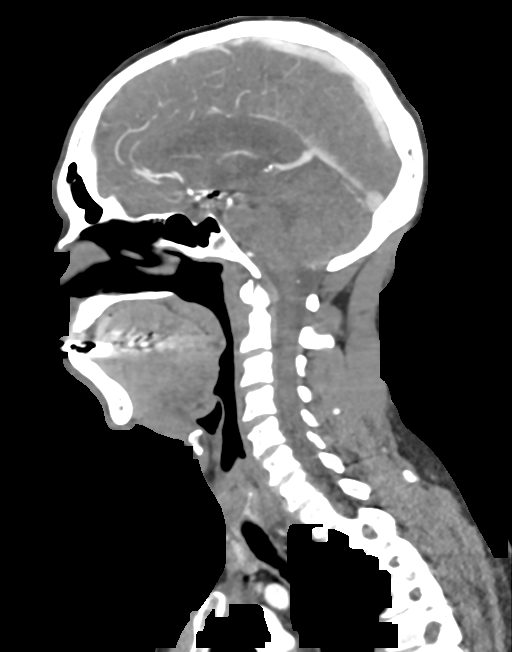

[9 of 33 positions shown; findings below may reference images not displayed]

FINDINGS: CT HEAD FINDINGS

Brain: Small foci of hypodensity in the right cerebral hemisphere
are seen corresponding to previously seen infarcts on the MRI of
[DATE]. There is no evidence of hemorrhagic transformation of
these infarcts

There is no evidence of new acute infarct. There is no acute
intracranial hemorrhage or extra-axial fluid collection.

The ventricles are normal in size. There is no mass lesion. There is
no mass effect or midline shift.

Vascular: Postsurgical changes reflecting stent mediated coil
embolization of the right intracranial ICA aneurysm are again seen.
No dense vessel sign is seen.

Skull: Normal. Negative for fracture or focal lesion.

Sinuses/Orbits: The paranasal sinuses are clear. Globes and orbits
are unremarkable.

Other: None.

Review of the MIP images confirms the above findings

CTA NECK FINDINGS

Aortic arch: Aortic arch is normal. The origins of the major branch
vessels are patent. The subclavian arteries are patent to the level
imaged.

Right carotid system: The right common, internal, and external
carotid arteries are patent, without hemodynamically significant
stenosis or occlusion. There is no dissection or aneurysm.
Additionally, no carotid web is identified, as was suspected on the
prior catheter angiogram.

Left carotid system: Left common, internal, and external carotid
arteries are patent, without hemodynamically significant stenosis or
occlusion. There is no dissection or aneurysm.

Vertebral arteries: The vertebral arteries are patent, without
hemodynamically significant stenosis or occlusion. There is no
dissection or aneurysm.

Skeleton: There is no acute osseous abnormality or suspicious
osseous lesion. There is no visible canal hematoma.

Other neck: The soft tissues of the neck are unremarkable.

Upper chest: Emphysema is again seen in the lung apices.

Review of the MIP images confirms the above findings

CTA HEAD FINDINGS

Anterior circulation: The patient is status post stent mediated coil
embolization of the right intracranial ICA aneurysm. The stent
appears patent, though evaluation for in stent stenosis is limited
by artifact from the stent. There is no definite evidence of
recurrent aneurysm. The right M1 segment/communicating ICA is patent
distal to the stent.

Left intracranial ICA is patent.

The bilateral MCAs are patent.

The left A1 segment is hypoplastic or absent, likely developmental.
Otherwise, the bilateral ACAs are patent

There is no new aneurysm.  There is no AVM.

Posterior circulation: The bilateral V4 segments are patent. PICA is
identified bilaterally. The basilar artery is patent.

The left PCA is patent. The proximal right PCA is suboptimally
evaluated due to streak artifact from the stent. Within this
confine, the right PCA appears patent. The posterior communicating
arteries are not identified.

There is no aneurysm or AVM.

Venous sinuses: There is unchanged asymmetric non opacification of
the right cavernous sinus. The venous structures are otherwise
patent.

Anatomic variants: None.

Review of the MIP images confirms the above findings
IMPRESSION: 1. Scattered evolving subacute infarcts in the right cerebral
hemisphere as seen on the brain MRI of [DATE]. No definite new
acute infarct. No acute intracranial hemorrhage. This was discussed
with Dr. FELECIA at [DATE].
2. Postprocedural changes reflecting stent mediated coil
embolization of a right intracranial ICA aneurysm. The stent appears
patent but as before evaluation for in stent stenosis is limited by
artifact. This was discussed with Dr FELECIA at [DATE].
3. Otherwise, patent vasculature of the head and neck with no
hemodynamically significant stenosis or occlusion. Specifically, no
carotid web is seen on the right as was suspected on the prior
catheter angiogram.

## 2022-02-08 IMAGING — CT CT HEAD CODE STROKE
3 of 4 series · 13 of 47 positions shown, 15 images · IV contrast (omnipaque)
Comparison: MR head and CT/CTA [DATE]

CLINICAL DATA: Code stroke. Recent stroke last week, new left
facial numbness starting at 4 p.m.

EXAM:
CT ANGIOGRAPHY HEAD AND NECK
TECHNIQUE: Multidetector CT imaging of the head and neck was performed using
the standard protocol during bolus administration of intravenous
contrast. Multiplanar CT image reconstructions and MIPs were
obtained to evaluate the vascular anatomy. Carotid stenosis
measurements (when applicable) are obtained utilizing NASCET
criteria, using the distal internal carotid diameter as the
denominator.
RADIATION DOSE REDUCTION: This exam was performed according to the
departmental dose-optimization program which includes automated
exposure control, adjustment of the mA and/or kV according to
patient size and/or use of iterative reconstruction technique.
CONTRAST:  50 cc Omnipaque 350

[Series 2: head wo · axial · 0.48mm/px · z∈[+1390,+1526]mm · 7 of 37 slices shown, 9 images]
[im 5/37  brain]
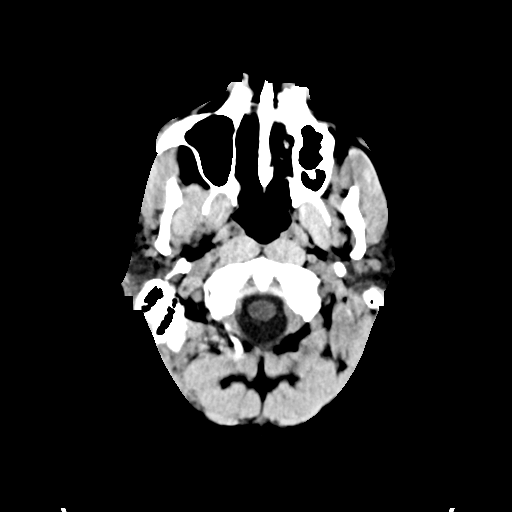
[im 5/37  bone]
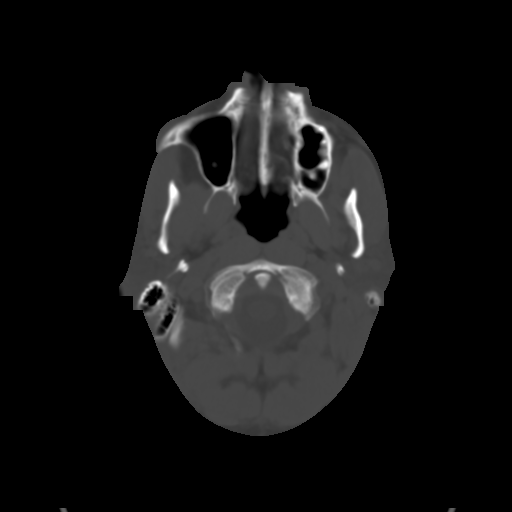
[im 10/37  brain]
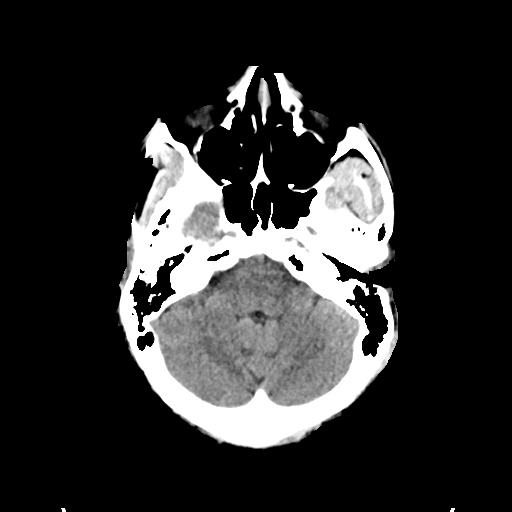
[im 14/37  brain]
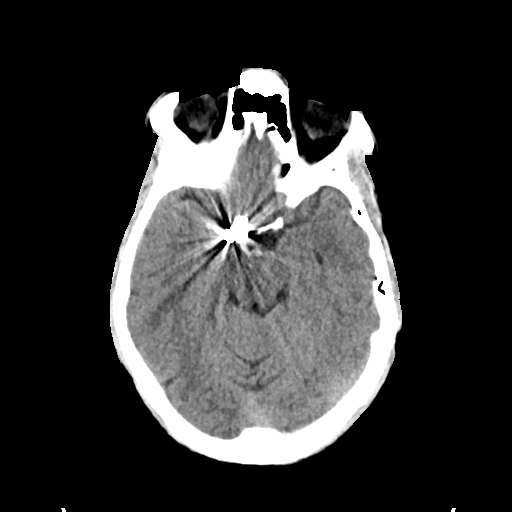
[im 19/37  brain]
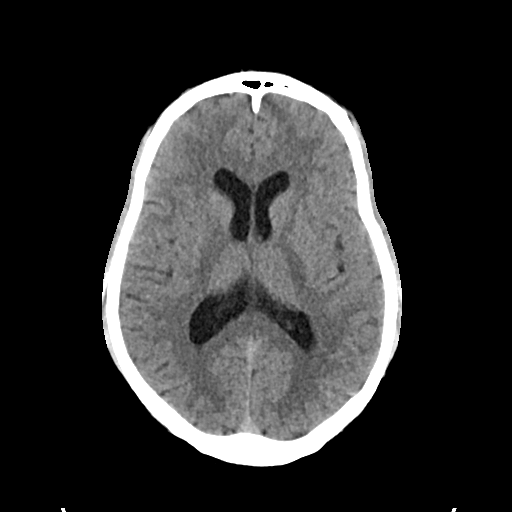
[im 23/37  brain]
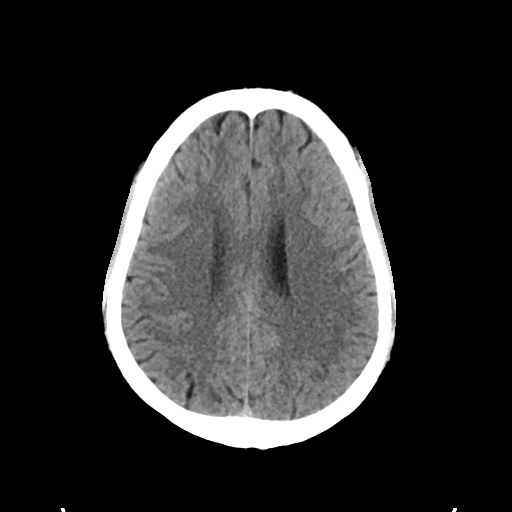
[im 23/37  bone]
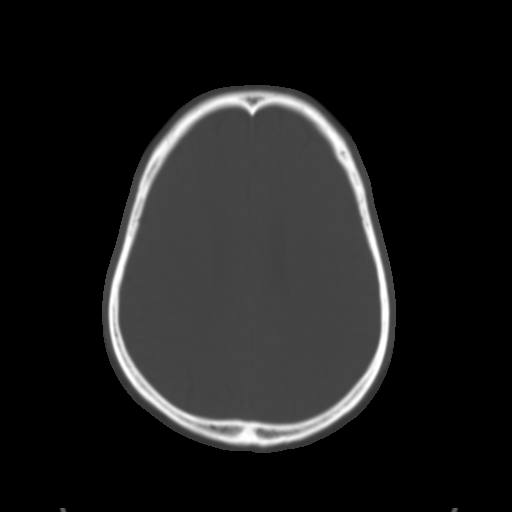
[im 28/37  brain]
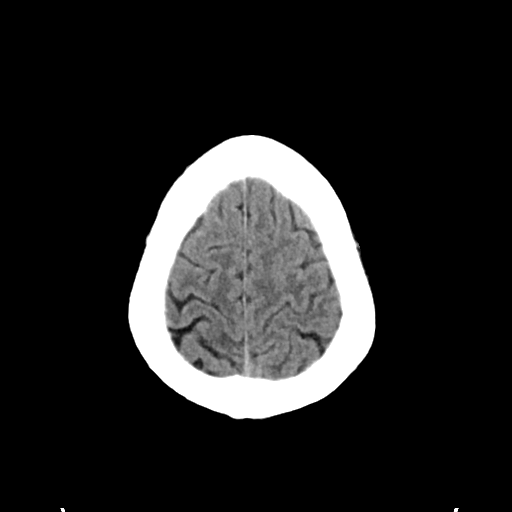
[im 32/37  brain]
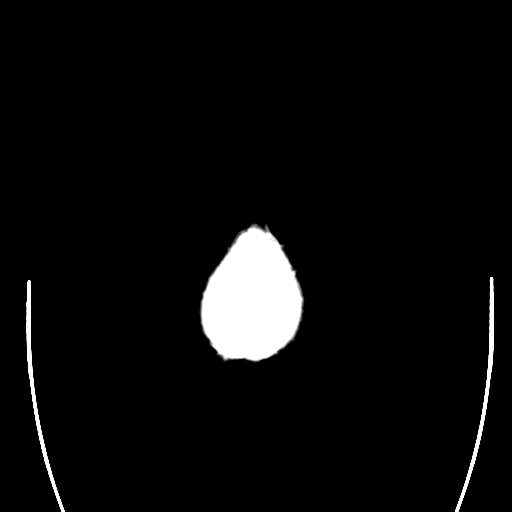

[Series 5: cor soft · coronal · 0.34mm/px · 3 of 75 slices shown]
[im 25/75  brain]
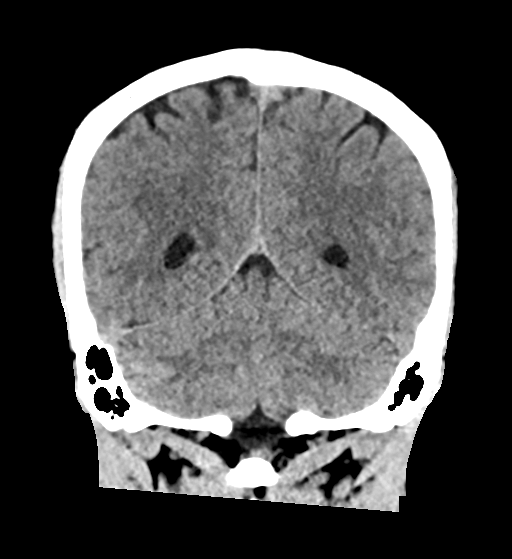
[im 33/75  brain]
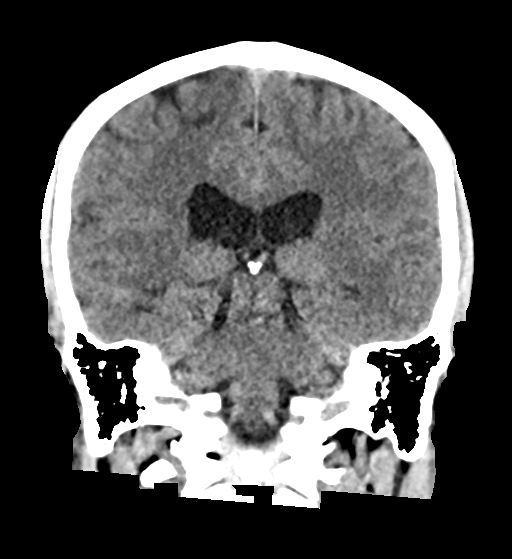
[im 42/75  brain]
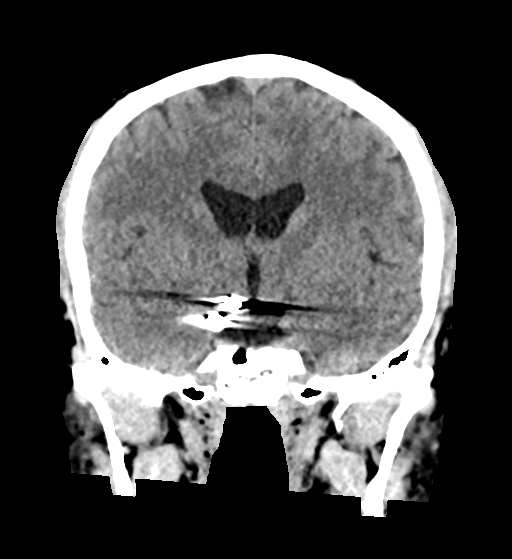

[Series 6: sag soft · sagittal · 0.36mm/px · 3 of 59 slices shown]
[im 20/59  brain]
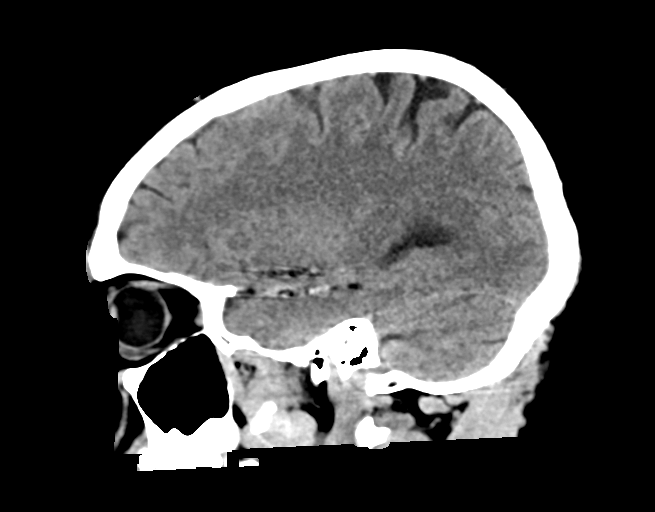
[im 30/59  brain]
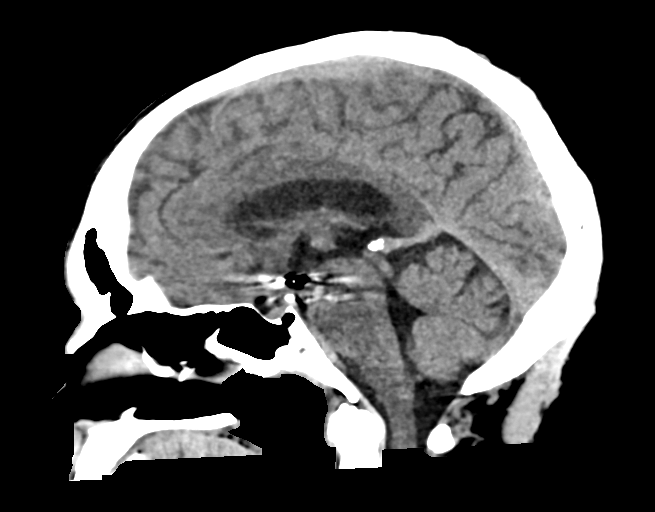
[im 39/59  brain]
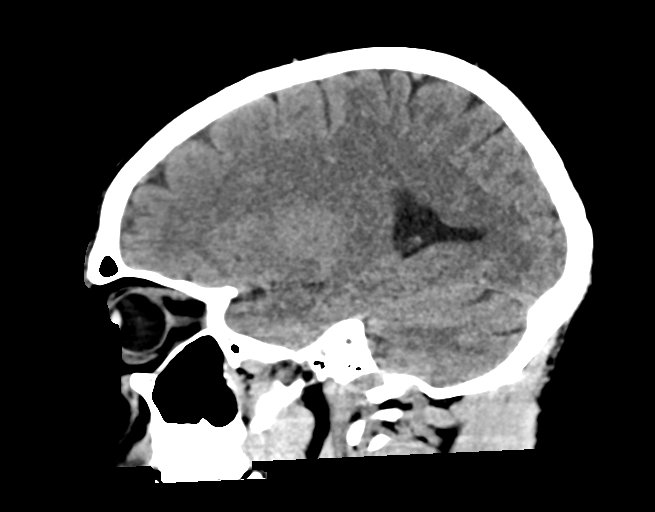

[13 of 47 positions shown; findings below may reference images not displayed]

FINDINGS: CT HEAD FINDINGS

Brain: Small foci of hypodensity in the right cerebral hemisphere
are seen corresponding to previously seen infarcts on the MRI of
[DATE]. There is no evidence of hemorrhagic transformation of
these infarcts

There is no evidence of new acute infarct. There is no acute
intracranial hemorrhage or extra-axial fluid collection.

The ventricles are normal in size. There is no mass lesion. There is
no mass effect or midline shift.

Vascular: Postsurgical changes reflecting stent mediated coil
embolization of the right intracranial ICA aneurysm are again seen.
No dense vessel sign is seen.

Skull: Normal. Negative for fracture or focal lesion.

Sinuses/Orbits: The paranasal sinuses are clear. Globes and orbits
are unremarkable.

Other: None.

Review of the MIP images confirms the above findings

CTA NECK FINDINGS

Aortic arch: Aortic arch is normal. The origins of the major branch
vessels are patent. The subclavian arteries are patent to the level
imaged.

Right carotid system: The right common, internal, and external
carotid arteries are patent, without hemodynamically significant
stenosis or occlusion. There is no dissection or aneurysm.
Additionally, no carotid web is identified, as was suspected on the
prior catheter angiogram.

Left carotid system: Left common, internal, and external carotid
arteries are patent, without hemodynamically significant stenosis or
occlusion. There is no dissection or aneurysm.

Vertebral arteries: The vertebral arteries are patent, without
hemodynamically significant stenosis or occlusion. There is no
dissection or aneurysm.

Skeleton: There is no acute osseous abnormality or suspicious
osseous lesion. There is no visible canal hematoma.

Other neck: The soft tissues of the neck are unremarkable.

Upper chest: Emphysema is again seen in the lung apices.

Review of the MIP images confirms the above findings

CTA HEAD FINDINGS

Anterior circulation: The patient is status post stent mediated coil
embolization of the right intracranial ICA aneurysm. The stent
appears patent, though evaluation for in stent stenosis is limited
by artifact from the stent. There is no definite evidence of
recurrent aneurysm. The right M1 segment/communicating ICA is patent
distal to the stent.

Left intracranial ICA is patent.

The bilateral MCAs are patent.

The left A1 segment is hypoplastic or absent, likely developmental.
Otherwise, the bilateral ACAs are patent

There is no new aneurysm.  There is no AVM.

Posterior circulation: The bilateral V4 segments are patent. PICA is
identified bilaterally. The basilar artery is patent.

The left PCA is patent. The proximal right PCA is suboptimally
evaluated due to streak artifact from the stent. Within this
confine, the right PCA appears patent. The posterior communicating
arteries are not identified.

There is no aneurysm or AVM.

Venous sinuses: There is unchanged asymmetric non opacification of
the right cavernous sinus. The venous structures are otherwise
patent.

Anatomic variants: None.

Review of the MIP images confirms the above findings
IMPRESSION: 1. Scattered evolving subacute infarcts in the right cerebral
hemisphere as seen on the brain MRI of [DATE]. No definite new
acute infarct. No acute intracranial hemorrhage. This was discussed
with Dr. FELECIA at [DATE].
2. Postprocedural changes reflecting stent mediated coil
embolization of a right intracranial ICA aneurysm. The stent appears
patent but as before evaluation for in stent stenosis is limited by
artifact. This was discussed with Dr FELECIA at [DATE].
3. Otherwise, patent vasculature of the head and neck with no
hemodynamically significant stenosis or occlusion. Specifically, no
carotid web is seen on the right as was suspected on the prior
catheter angiogram.

## 2022-02-08 SURGERY — ECHOCARDIOGRAM, TRANSESOPHAGEAL
Anesthesia: Monitor Anesthesia Care

## 2022-02-08 MED ORDER — STROKE: EARLY STAGES OF RECOVERY BOOK
Freq: Once | Status: AC
  Filled 2022-02-08: qty 1

## 2022-02-08 MED ORDER — TICAGRELOR 90 MG PO TABS
180.0000 mg | ORAL_TABLET | Freq: Once | ORAL | Status: AC
Start: 2022-02-08 — End: 2022-02-08
  Administered 2022-02-08: 180 mg via ORAL
  Filled 2022-02-08: qty 2

## 2022-02-08 MED ORDER — PROPOFOL 10 MG/ML IV BOLUS
INTRAVENOUS | Status: DC | PRN
  Administered 2022-02-08: 50 mg via INTRAVENOUS
  Administered 2022-02-08: 20 mg via INTRAVENOUS
  Administered 2022-02-08: 30 mg via INTRAVENOUS

## 2022-02-08 MED ORDER — ACETAMINOPHEN 650 MG RE SUPP: 650.0000 mg | RECTAL | Status: DC | PRN

## 2022-02-08 MED ORDER — SODIUM CHLORIDE 0.9 % IV SOLN
INTRAVENOUS | Status: AC | PRN
  Administered 2022-02-08: 500 mL via INTRAVENOUS

## 2022-02-08 MED ORDER — ACETAMINOPHEN 325 MG PO TABS: 650.0000 mg | ORAL_TABLET | ORAL | Status: DC | PRN

## 2022-02-08 MED ORDER — ASPIRIN 81 MG PO TBEC
81.0000 mg | DELAYED_RELEASE_TABLET | Freq: Every day | ORAL | Status: DC
  Administered 2022-02-08 – 2022-02-09 (×2): 81 mg via ORAL
  Filled 2022-02-08 (×2): qty 1

## 2022-02-08 MED ORDER — IOHEXOL 350 MG/ML SOLN
50.0000 mL | Freq: Once | INTRAVENOUS | Status: AC | PRN
Start: 2022-02-08 — End: 2022-02-08
  Administered 2022-02-08: 50 mL via INTRAVENOUS

## 2022-02-08 MED ORDER — PROPOFOL 500 MG/50ML IV EMUL
INTRAVENOUS | Status: DC | PRN
  Administered 2022-02-08: 125 ug/kg/min via INTRAVENOUS

## 2022-02-08 MED ORDER — ASPIRIN 81 MG PO TBEC: 81.0000 mg | DELAYED_RELEASE_TABLET | Freq: Every day | ORAL | Status: DC

## 2022-02-08 MED ORDER — ATORVASTATIN CALCIUM 40 MG PO TABS
40.0000 mg | ORAL_TABLET | Freq: Every day | ORAL | Status: DC
  Administered 2022-02-08 – 2022-02-09 (×2): 40 mg via ORAL
  Filled 2022-02-08 (×2): qty 1

## 2022-02-08 MED ORDER — LORAZEPAM 2 MG/ML IJ SOLN
1.0000 mg | Freq: Once | INTRAMUSCULAR | Status: AC
Start: 2022-02-08 — End: 2022-02-09
  Administered 2022-02-09: 1 mg via INTRAVENOUS
  Filled 2022-02-08: qty 1

## 2022-02-08 MED ORDER — SENNOSIDES-DOCUSATE SODIUM 8.6-50 MG PO TABS: 1.0000 | ORAL_TABLET | Freq: Every evening | ORAL | Status: DC | PRN

## 2022-02-08 MED ORDER — PHENYLEPHRINE 80 MCG/ML (10ML) SYRINGE FOR IV PUSH (FOR BLOOD PRESSURE SUPPORT)
PREFILLED_SYRINGE | INTRAVENOUS | Status: DC | PRN
  Administered 2022-02-08 (×2): 160 ug via INTRAVENOUS

## 2022-02-08 MED ORDER — ENOXAPARIN SODIUM 40 MG/0.4ML IJ SOSY
40.0000 mg | PREFILLED_SYRINGE | INTRAMUSCULAR | Status: DC
  Administered 2022-02-08: 40 mg via SUBCUTANEOUS
  Filled 2022-02-08: qty 0.4

## 2022-02-08 MED ORDER — ACETAMINOPHEN 160 MG/5ML PO SOLN: 650.0000 mg | ORAL | Status: DC | PRN

## 2022-02-08 MED ORDER — TICAGRELOR 90 MG PO TABS
90.0000 mg | ORAL_TABLET | Freq: Two times a day (BID) | ORAL | Status: DC
  Administered 2022-02-09: 90 mg via ORAL
  Filled 2022-02-08: qty 1

## 2022-02-08 NOTE — Progress Notes (Signed)
Received patient from ED alert and oriented x4 accompanied by transport staff. Oriented to room set up. Tele monitor connected with CCMD. Call light within reach. Bed at lowest position. Patient verbalized understanding of safety precautions.

## 2022-02-08 NOTE — Consult Note (Signed)
Neurology Consultation  Reason for Consult: Code stroke Referring Physician: dr Rush Landmark  CC: left side facial numbness and left leg weakness   History is obtained from:patient and medical record   HPI: Carl Armstrong is a 51 y.o. 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 NIHSS 2. Code stroke activated.  Please see ED consult and progress notes are from earlier this week where he was evaluated for left-sided weakness and found to have right hemispheric watershed strokes with patent right ICA.  As a part of the work-up, he was recommended to go for an outpatient TEE which he was having today and after that he started noticing left lower facial numbness.  His left leg was much weaker earlier this week and is much better but is still not full strength. The ED provider evaluated him and because of the recent stroke, discussed with me prior to activating the code stroke. Given the acute onset of symptoms, although we are not going to administer TNKase, code stroke activation was prudent to prioritize imaging and get further input on his condition.  LKW: 1600 tpa given?: no, recent stroke less than a week ago Premorbid modified Rankin scale (mRS): 2   ROS: Full ROS was performed and is negative except as noted in the HPI.  Past Medical History:  Diagnosis Date   Brain aneurysm 2017   Hypertension    Essential (primary) hypertension   History reviewed. No pertinent family history.   Social History:   reports that he has been smoking cigarettes. He has never used smokeless tobacco. He reports that he does not currently use alcohol. He reports that he does not currently use drugs.  Medications  Current Facility-Administered Medications:    aspirin EC tablet 81 mg,  81 mg, Oral, Daily, Milon Dikes, MD   ticagrelor [facility]) tablet 180 mg, 180 mg, Oral, Once, Milon Dikes, MD   [START ON 02/09/2022] ticagrelor (BRILINTA) tablet 90 mg, 90 mg, Oral, BID, Milon Dikes, MD  Current Outpatient Medications:    aspirin EC 81 MG tablet, Take 1 tablet (81 mg total) by mouth daily for 17 days. Swallow whole., Disp: 17 tablet, Rfl: 0   atorvastatin (LIPITOR) 40 MG tablet, Take 1 tablet (40 mg total) by mouth daily., Disp: 30 tablet, Rfl: 0   clopidogrel (PLAVIX) 75 MG tablet, Take 1 tablet (75 mg total) by mouth daily., Disp: 60 tablet, Rfl: 0   mirtazapine (REMERON) 15 MG tablet, Take 15 mg by mouth at bedtime., Disp: , Rfl:    Exam: Current vital signs: BP (!) 135/97   Pulse 78   Temp 98.2 F (36.8 C) (Oral)   Resp 18   SpO2 100%  Vital signs in last 24 hours: Temp:  [97.7 F (36.5 C)-98.2 F (36.8 C)] 98.2 F (36.8 C) (06/02 1741) Pulse Rate:  [76-103] 78 (06/02 1745) Resp:  [16-24] 18 (06/02 1741) BP: (107-144)/(78-98) 135/97 (06/02 1745) SpO2:  [96 %-100 %] 100 % (06/02 1745) Weight:  [70.3 kg] 70.3 kg (06/02 0728) GENERAL: Awake, alert in NAD HEENT: - Normocephalic and atraumatic, dry mm LUNGS - Clear to auscultation bilaterally with no wheezes CV - S1S2 RRR, no m/r/g, equal pulses bilaterally. ABDOMEN - Soft, nontender, nondistended with normoactive BS Ext: warm, well perfused, intact peripheral pulses, no  edema  NEURO:  Awake alert oriented x3 No dysarthria No aphasia Cranial nerves II to XII: Pupils equal round react light, extra movements intact, visual fields full, facial sensation diminished on the right lower face but otherwise intact on the upper face and scalp, face appears symmetric, auditory acuity intact, tongue and palate midline. Motor examination with mild left lower extremity vertical drift-otherwise no drift. Sensation intact everywhere except the face as documented above Coordination with no dysmetria NIH stroke  scale-2  Imaging I have reviewed the images obtained:  Code stroke CT-head No acute process   CT angiography head and neck with no change from earlier this week.  Patent right ICA stent with no hemodynamically significant stenosis.  No carotid web seen on the right as was suspected on the prior catheter angiogram.  MRI examination of the brain: From earlier this week with right MCA ACA and MCA PCA watershed infarcts.  Assessment:  51 year old man with a right ICA pipeline stent after a subarachnoid hemorrhage, recent right hemispheric watershed strokes presenting for evaluation of right lower facial numbness of sudden onset at 4 PM. Recent stroke precludes IV TNKase Stat head imaging with no acute findings on the CT head.  CT angiography also reveals no change from the CT angiogram from earlier this week. Likely symptomatology from possible microembolization from the stent. He is on aspirin and Plavix-we will change Plavix to Brilinta. Will need admission for observation in case he gets worse and further imaging   Impression: Evaluate for new right hemispheric strokes Recent right hemispheric watershed strokes Prior history of subarachnoid hemorrhage, right ICA aneurysm status post pipeline stent  Recommendations: Admit to hospitalist Frequent neurochecks No need to repeat double stroke work-up MRI of the brain without contrast needs to be repeated For his medications, on aspirin and Plavix at home.  Will change Plavix to Brilinta. Brilinta load for tonight and starting tomorrow morning Brilinta 90 twice daily. Continue aspirin 81 mg p.o. daily. Continue atorvastatin 40 mg p.o. now and daily PT OT Speech therapy N.p.o. until cleared by swallow evaluation If he worsens, please call neurology again and the case might need to be discussed with endovascular for potential arteriogram. Stroke team to follow Case was discussed with the ED provider as well as neuro interventional  radiologist on-call  -- Milon Dikes, MD Neurologist [facility] Pager: [phone]

## 2022-02-08 NOTE — CV Procedure (Signed)
Brief TEE Note  LVEF 60-65% No LA/LAA thrombus or masses No intracardiac shunting by color flow Doppler or saline microcavitation study Trivial MR, trivial TR  For additional details see full report.  Roma Bondar C. Duke Salvia, MD, [facility] 02/08/2022 8:21 AM

## 2022-02-08 NOTE — H&P (Signed)
Carl Armstrong is a 51 y.o. male who has presented today for surgery, with the diagnosis of stroke.  The various methods of treatment have been discussed with the patient and family. After consideration of risks, benefits and other options for treatment, the patient has consented to  Procedure(s): TRANSESOPHAGEAL ECHOCARDIOGRAM (TEE) (N/A) as a surgical intervention .  The patient's history has been reviewed, patient examined, no change in status, stable for surgery.  I have reviewed the patient's chart and labs.  Questions were answered to the patient's satisfaction.    Gid Schoffstall C. Oval Linsey, MD, [facility]  02/08/2022 8:03 AM

## 2022-02-08 NOTE — Code Documentation (Incomplete)
Stroke Response Nurse Documentation Code Documentation  Carl Armstrong is a 51 y.o. male arriving to [facility]  via [facility] on 02/08/2022 with past medical hx of HTN, history of [place] 2017 ICA aneurysm x2 coiled with subsequent PED placement. On aspirin 81 mg daily. Code stroke was activated by ED.   Patient from home where he was LKW at 1600 and now complaining of left facial numbness.  Stroke team at the bedside on patient arrival. Labs drawn and patient cleared for CT by Dr. Sherry Ruffing. Patient to CT with team. NIHSS 2, see documentation for details and code stroke times. Patient with left leg weakness and left decreased sensation on exam. The following imaging was completed:  CT Head and CTA. Patient is not a candidate for IV Thrombolytic due to mild symptoms. Patient is not a candidate for IR due to negative for LVO.   Care Plan: q30 min NIHSS and VS.   Bedside handoff with ED RN Chloe.    Demaria Deeney L Avrielle Fry  Rapid Response RN

## 2022-02-08 NOTE — Progress Notes (Signed)
  Echocardiogram 2D Echocardiogram has been performed.  Gerda Diss 02/08/2022, 8:47 AM

## 2022-02-08 NOTE — Transfer of Care (Signed)
Immediate Anesthesia Transfer of Care Note  Patient: Carl Armstrong  Procedure(s) Performed: TRANSESOPHAGEAL ECHOCARDIOGRAM (TEE) BUBBLE STUDY  Patient Location: PACU  Anesthesia Type:MAC  Level of Consciousness: drowsy  Airway & Oxygen Therapy: Patient Spontanous Breathing  Post-op Assessment: Report given to RN and Post -op Vital signs reviewed and stable  Post vital signs: Reviewed and stable  Last Vitals:  Vitals Value Taken Time  BP    Temp    Pulse    Resp    SpO2      Last Pain:  Vitals:   02/08/22 0728  TempSrc: Temporal  PainSc: 0-No pain         Complications: No notable events documented.

## 2022-02-08 NOTE — ED Provider Notes (Signed)
[facility] Provider Note   CSN: [id number] Arrival date & time: 02/08/22  1737     History  Chief Complaint  Patient presents with   stroke like symptoms    [facility] is a 51 y.o. male.  The history is provided by the patient and medical records. No language interpreter was used.  Neurologic Problem This is a new problem. The current episode started 1 to 2 hours ago. The problem occurs constantly. The problem has not changed since onset.Pertinent negatives include no chest pain, no abdominal pain, no headaches and no shortness of breath. Nothing aggravates the symptoms. Nothing relieves the symptoms. He has tried nothing for the symptoms. The treatment provided no relief.      Home Medications Prior to Admission medications   Medication Sig Start Date End Date Taking? Authorizing Provider  aspirin EC 81 MG tablet Take 1 tablet (81 mg total) by mouth daily for 17 days. Swallow whole. 02/05/22 02/22/22  Levin Erp, MD  atorvastatin (LIPITOR) 40 MG tablet Take 1 tablet (40 mg total) by mouth daily. 02/05/22 03/07/22  Levin Erp, MD  clopidogrel (PLAVIX) 75 MG tablet Take 1 tablet (75 mg total) by mouth daily. 02/05/22   Levin Erp, MD  mirtazapine (REMERON) 15 MG tablet Take 15 mg by mouth at bedtime.    [provider]      Allergies    Patient has no known allergies.    Review of Systems   Review of Systems  Constitutional:  Negative for chills, fatigue and fever.  HENT:  Negative for congestion, ear pain and sore throat.   Eyes:  Negative for pain and visual disturbance.  Respiratory:  Negative for cough, shortness of breath and wheezing.   Cardiovascular:  Negative for chest pain and palpitations.  Gastrointestinal:  Negative for abdominal pain, diarrhea, nausea and vomiting.  Genitourinary:  Negative for dysuria, flank pain and hematuria.  Musculoskeletal:  Negative for arthralgias and back pain.  Skin:   Negative for color change and rash.  Neurological:  Positive for weakness (at new baseline) and numbness. Negative for dizziness, seizures, syncope, facial asymmetry, speech difficulty, light-headedness and headaches.  Psychiatric/Behavioral:  Negative for agitation.   All other systems reviewed and are negative.  Physical Exam Updated Vital Signs BP (!) 135/95 (BP Location: Right Arm)   Pulse 86   Temp 98.2 F (36.8 C) (Oral)   Resp 18   SpO2 100%  Physical Exam Vitals and nursing note reviewed.  Constitutional:      General: He is not in acute distress.    Appearance: He is well-developed. He is not ill-appearing, toxic-appearing or diaphoretic.  HENT:     Head: Normocephalic and atraumatic.     Nose: Nose normal.     Mouth/Throat:     Mouth: Mucous membranes are moist.  Eyes:     Extraocular Movements: Extraocular movements intact.     Conjunctiva/sclera: Conjunctivae normal.     Pupils: Pupils are equal, round, and reactive to light.  Cardiovascular:     Rate and Rhythm: Normal rate and regular rhythm.     Heart sounds: No murmur heard. Pulmonary:     Effort: Pulmonary effort is normal. No respiratory distress.     Breath sounds: Normal breath sounds. No wheezing, rhonchi or rales.  Chest:     Chest wall: No tenderness.  Abdominal:     General: Abdomen is flat.     Palpations: Abdomen is soft.  Tenderness: There is no abdominal tenderness. There is no guarding or rebound.  Musculoskeletal:        General: No swelling or tenderness.     Cervical back: Neck supple.  Skin:    General: Skin is warm and dry.     Capillary Refill: Capillary refill takes less than 2 seconds.     Findings: No erythema or rash.  Neurological:     Mental Status: He is alert.     Sensory: Sensory deficit present.     Motor: Weakness present.     Comments: Left lower facial numbness sparing the forehead.  No facial droop.  Weakness in the left leg which patient reports is at his new  normal.  No arm symptoms.  No carotid bruit on my exam.  Lungs clear and chest nontender.  Psychiatric:        Mood and Affect: Mood normal.    ED Results / Procedures / Treatments   Labs (all labs ordered are listed, but only abnormal results are displayed) Labs Reviewed  CBC - Abnormal; Notable for the following components:      Result Value   MCV 75.2 (*)    MCH 24.0 (*)    All other components within normal limits  COMPREHENSIVE METABOLIC PANEL - Abnormal; Notable for the following components:   Creatinine, Ser 1.37 (*)    All other components within normal limits  URINALYSIS, ROUTINE W REFLEX MICROSCOPIC - Abnormal; Notable for the following components:   Specific Gravity, Urine 1.038 (*)    All other components within normal limits  CBG MONITORING, ED - Abnormal; Notable for the following components:   Glucose-Capillary 107 (*)    All other components within normal limits  I-STAT CHEM 8, ED - Abnormal; Notable for the following components:   Creatinine, Ser 1.40 (*)    Calcium, Ion 0.91 (*)    All other components within normal limits  RESP PANEL BY RT-PCR (FLU A&B, COVID) ARPGX2  ETHANOL  PROTIME-INR  APTT  DIFFERENTIAL  RAPID URINE DRUG SCREEN, HOSP PERFORMED  BASIC METABOLIC PANEL    EKG None  Radiology ECHO TEE  Result Date: 02/08/2022    TRANSESOPHOGEAL ECHO REPORT   Patient Name:   [facility] Date of Exam: 02/08/2022 Medical Rec #:  [id number]    Height:       65.0 in Accession #:    [id number]   Weight:       155.0 lb Date of Birth:  October 11, 1970    BSA:          1.775 m Patient Age:    50 years     BP:           130/92 mmHg Patient Gender: M            HR:           82 bpm. Exam Location:  Inpatient Procedure: Transesophageal Echo, Cardiac Doppler and Color Doppler                                 MODIFIED REPORT: This report was modified by Chilton Si MD on 02/08/2022 due to bubble study.  Indications:     Stroke  History:         Patient has prior history of  Echocardiogram examinations, most                  recent 02/03/2022. Risk  Factors:Hypertension and Current Smoker.  Sonographer:     Ross Ludwig RDCS (AE) Referring Phys:  [id number] Cyndi Bender Diagnosing Phys: Chilton Si MD PROCEDURE: After discussion of the risks and benefits of a TEE, an informed consent was obtained from the patient. The transesophogeal probe was passed without difficulty through the esophogus of the patient. Sedation performed by different physician. The patient was monitored while under deep sedation. Anesthestetic sedation was provided intravenously by Anesthesiology: 205.  of Propofol. Image quality was good. The patient's vital signs; including heart rate, blood pressure, and oxygen saturation; remained stable throughout the procedure. The patient developed no complications during the procedure. IMPRESSIONS  1. Left ventricular ejection fraction, by estimation, is 60 to 65%. The left ventricle has normal function. The left ventricle has no regional wall motion abnormalities.  2. Right ventricular systolic function is normal. The right ventricular size is normal.  3. No left atrial/left atrial appendage thrombus was detected. The LAA emptying velocity was 88 cm/s.  4. The mitral valve is normal in structure. Trivial mitral valve regurgitation. No evidence of mitral stenosis.  5. The aortic valve is tricuspid. Aortic valve regurgitation is not visualized. No aortic stenosis is present.  6. The inferior vena cava is normal in size with greater than 50% respiratory variability, suggesting right atrial pressure of 3 mmHg.  7. Agitated saline contrast bubble study was negative, with no evidence of any interatrial shunt. Conclusion(s)/Recommendation(s): Normal biventricular function without evidence of hemodynamically significant valvular heart disease. FINDINGS  Left Ventricle: Left ventricular ejection fraction, by estimation, is 60 to 65%. The left ventricle has normal function. The left  ventricle has no regional wall motion abnormalities. The left ventricular internal cavity size was normal in size. There is  no left ventricular hypertrophy. Right Ventricle: The right ventricular size is normal. No increase in right ventricular wall thickness. Right ventricular systolic function is normal. Left Atrium: Left atrial size was normal in size. No left atrial/left atrial appendage thrombus was detected. The LAA emptying velocity was 88 cm/s. Right Atrium: Right atrial size was normal in size. Pericardium: There is no evidence of pericardial effusion. Mitral Valve: The mitral valve is normal in structure. Trivial mitral valve regurgitation. No evidence of mitral valve stenosis. Tricuspid Valve: The tricuspid valve is normal in structure. Tricuspid valve regurgitation is trivial. No evidence of tricuspid stenosis. Aortic Valve: The aortic valve is tricuspid. Aortic valve regurgitation is not visualized. No aortic stenosis is present. Pulmonic Valve: The pulmonic valve was normal in structure. Pulmonic valve regurgitation is trivial. No evidence of pulmonic stenosis. Aorta: The aortic root is normal in size and structure. Venous: The inferior vena cava is normal in size with greater than 50% respiratory variability, suggesting right atrial pressure of 3 mmHg. IAS/Shunts: No atrial level shunt detected by color flow Doppler. Agitated saline contrast was given intravenously to evaluate for intracardiac shunting. Agitated saline contrast bubble study was negative, with no evidence of any interatrial shunt. There  is no evidence of a patent foramen ovale. There is no evidence of an atrial septal defect. Chilton Si MD Electronically signed by Chilton Si MD Signature Date/Time: 02/08/2022/1:35:43 PM    Final (Updated)    CT HEAD CODE STROKE WO CONTRAST  Result Date: 02/08/2022 CLINICAL DATA:  Code stroke. Recent stroke last week, new left facial numbness starting at 4 p.m. EXAM: CT ANGIOGRAPHY HEAD  AND NECK TECHNIQUE: Multidetector CT imaging of the head and neck was performed using the standard protocol during bolus administration of intravenous  contrast. Multiplanar CT image reconstructions and MIPs were obtained to evaluate the vascular anatomy. Carotid stenosis measurements (when applicable) are obtained utilizing NASCET criteria, using the distal internal carotid diameter as the denominator. RADIATION DOSE REDUCTION: This exam was performed according to the departmental dose-optimization program which includes automated exposure control, adjustment of the mA and/or kV according to patient size and/or use of iterative reconstruction technique. CONTRAST:  50 cc Omnipaque 350 COMPARISON:  MR head and CT/CTA 02/02/2022 FINDINGS: CT HEAD FINDINGS Brain: Small foci of hypodensity in the right cerebral hemisphere are seen corresponding to previously seen infarcts on the MRI of 02/02/2022. There is no evidence of hemorrhagic transformation of these infarcts There is no evidence of new acute infarct. There is no acute intracranial hemorrhage or extra-axial fluid collection. The ventricles are normal in size. There is no mass lesion. There is no mass effect or midline shift. 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 No dense vessel sign is seen. Skull: Normal. Negative for fracture or focal lesion. Sinuses/Orbits: The paranasal sinuses are clear. Globes and orbits are unremarkable. Other: None. Review of the MIP images confirms the above findings CTA NECK FINDINGS Aortic arch: Aortic arch is normal. The origins of the major branch vessels are patent. The subclavian arteries are patent to the level imaged. Right carotid system: The right common, internal, and external carotid arteries are patent, without hemodynamically significant stenosis or occlusion. There is no dissection or aneurysm. Additionally, no carotid web is  identified, as was suspected on the prior catheter angiogram. Left carotid system: Left common, internal, and external carotid arteries are patent, without hemodynamically significant stenosis or occlusion. There is no dissection or aneurysm. Vertebral arteries: The vertebral arteries are patent, without hemodynamically significant stenosis or occlusion. There is no dissection or aneurysm. Skeleton: There is no acute osseous abnormality or suspicious osseous lesion. There is no visible canal hematoma. Other neck: The soft tissues of the neck are unremarkable. Upper chest: Emphysema is again seen in the lung apices. 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 The stent appears patent, though evaluation for in stent stenosis is limited by artifact from the stent. There is no definite evidence of recurrent aneurysm. The right M1 segment/communicating ICA is patent distal to the stent. Left intracranial ICA is patent. The bilateral MCAs are patent. The left A1 segment is hypoplastic or absent, likely developmental. Otherwise, the bilateral ACAs are patent There is no new aneurysm.  There is no AVM. Posterior circulation: The bilateral V4 segments are patent. PICA is identified bilaterally. The basilar artery is patent. The left PCA is patent. The proximal right PCA is suboptimally evaluated due to streak artifact from the stent. Within this confine, the right PCA appears patent. The posterior communicating arteries are not identified. There is no aneurysm or AVM. Venous sinuses: There is unchanged asymmetric non opacification of the right cavernous sinus. The venous structures are otherwise patent. Anatomic variants: None. Review of the MIP images confirms the above findings IMPRESSION: 1. Scattered evolving subacute infarcts in the right cerebral hemisphere as seen on the  brain MRI of 05/027/2023. No definite new acute infarct. No acute intracranial hemorrhage. This was discussed with Dr. Wilford CornerArora at 6:01 pm. 2. Postprocedural changes reflecting stent mediated coil embolization of a right intracranial ICA aneurysm. The stent appears patent but as  before evaluation for in stent stenosis is limited by artifact. This was discussed with Dr Wilford Corner at 6:10 pm. 3. Otherwise, patent vasculature of the head and neck with no hemodynamically significant stenosis or occlusion. Specifically, no carotid web is seen on the right as was suspected on the prior catheter angiogram. Electronically Signed   By: Lesia Hausen M.D.   On: 02/08/2022 18:16   CT ANGIO HEAD NECK W WO CM (CODE STROKE)  Result Date: 02/08/2022 CLINICAL DATA:  Code stroke. Recent stroke last week, new left facial numbness starting at 4 p.m. EXAM: CT ANGIOGRAPHY HEAD AND NECK TECHNIQUE: Multidetector CT imaging of the head and neck was performed using the standard protocol during bolus administration of intravenous contrast. Multiplanar CT image reconstructions and MIPs were obtained to evaluate the vascular anatomy. Carotid stenosis measurements (when applicable) are obtained utilizing NASCET criteria, using the distal internal carotid diameter as the denominator. RADIATION DOSE REDUCTION: This exam was performed according to the departmental dose-optimization program which includes automated exposure control, adjustment of the mA and/or kV according to patient size and/or use of iterative reconstruction technique. CONTRAST:  50 cc Omnipaque 350 COMPARISON:  MR head and CT/CTA 02/02/2022 FINDINGS: CT HEAD FINDINGS Brain: Small foci of hypodensity in the right cerebral hemisphere are seen corresponding to previously seen infarcts on the MRI of 02/02/2022. There is no evidence of hemorrhagic transformation of these infarcts There is no evidence of new acute infarct. There is no acute intracranial hemorrhage or extra-axial fluid  collection. The ventricles are normal in size. There is no mass lesion. There is no mass effect or midline shift. 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 No dense vessel sign is seen. Skull: Normal. Negative for fracture or focal lesion. Sinuses/Orbits: The paranasal sinuses are clear. Globes and orbits are unremarkable. Other: None. Review of the MIP images confirms the above findings CTA NECK FINDINGS Aortic arch: Aortic arch is normal. The origins of the major branch vessels are patent. The subclavian arteries are patent to the level imaged. Right carotid system: The right common, internal, and external carotid arteries are patent, without hemodynamically significant stenosis or occlusion. There is no dissection or aneurysm. Additionally, no carotid web is identified, as was suspected on the prior catheter angiogram. Left carotid system: Left common, internal, and external carotid arteries are patent, without hemodynamically significant stenosis or occlusion. There is no dissection or aneurysm. Vertebral arteries: The vertebral arteries are patent, without hemodynamically significant stenosis or occlusion. There is no dissection or aneurysm. Skeleton: There is no acute osseous abnormality or suspicious osseous lesion. There is no visible canal hematoma. Other neck: The soft tissues of the neck are unremarkable. Upper chest: Emphysema is again seen in the lung apices. 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 The stent appears patent, though evaluation for in stent stenosis is limited by artifact from the stent. There is no definite evidence of recurrent aneurysm. The right M1 segment/communicating ICA is patent distal to the stent. Left intracranial ICA is patent. The  bilateral MCAs are patent. The left A1 segment is hypoplastic or absent, likely developmental. Otherwise, the bilateral ACAs are patent There is no new aneurysm.  There is no AVM. Posterior circulation: The bilateral V4 segments are patent. PICA is identified bilaterally. The basilar artery is patent. The left PCA is  patent. The proximal right PCA is suboptimally evaluated due to streak artifact from the stent. Within this confine, the right PCA appears patent. The posterior communicating arteries are not identified. There is no aneurysm or AVM. Venous sinuses: There is unchanged asymmetric non opacification of the right cavernous sinus. The venous structures are otherwise patent. Anatomic variants: None. Review of the MIP images confirms the above findings IMPRESSION: 1. Scattered evolving subacute infarcts in the right cerebral hemisphere as seen on the brain MRI of 05/027/2023. No definite new acute infarct. No acute intracranial hemorrhage. This was discussed with Dr. Wilford Corner at 6:01 pm. 2. Postprocedural changes reflecting stent mediated coil embolization of a right intracranial ICA aneurysm. The stent appears patent but as before evaluation for in stent stenosis is limited by artifact. This was discussed with Dr Wilford Corner at 6:10 pm. 3. Otherwise, patent vasculature of the head and neck with no hemodynamically significant stenosis or occlusion. Specifically, no carotid web is seen on the right as was suspected on the prior catheter angiogram. Electronically Signed   By: Lesia Hausen M.D.   On: 02/08/2022 18:16    Procedures Procedures    CRITICAL CARE Performed by: [facility] Brim Satin Boal Total critical care time: 35 minutes Critical care time was exclusive of separately billable procedures and treating other patients. Critical care was necessary to treat or prevent imminent or life-threatening deterioration. Critical care was time spent personally by me on the following activities: development of treatment  plan with patient and/or surrogate as well as nursing, discussions with consultants, evaluation of patient's response to treatment, examination of patient, obtaining history from patient or surrogate, ordering and performing treatments and interventions, ordering and review of laboratory studies, ordering and review of radiographic studies, pulse oximetry and re-evaluation of patient's condition.   Medications Ordered in ED Medications  aspirin EC tablet 81 mg (81 mg Oral Given 02/08/22 1846)  ticagrelor (BRILINTA) tablet 90 mg (has no administration in time range)  acetaminophen (TYLENOL) tablet 650 mg (has no administration in time range)    Or  acetaminophen (TYLENOL) 160 MG/5ML solution 650 mg (has no administration in time range)    Or  acetaminophen (TYLENOL) suppository 650 mg (has no administration in time range)  senna-docusate (Senokot-S) tablet 1 tablet (has no administration in time range)  enoxaparin (LOVENOX) injection 40 mg (40 mg Subcutaneous Given 02/08/22 2107)  atorvastatin (LIPITOR) tablet 40 mg (40 mg Oral Given 02/08/22 2107)  LORazepam (ATIVAN) injection 1 mg (has no administration in time range)  iohexol (OMNIPAQUE) 350 MG/ML injection 50 mL (50 mLs Intravenous Contrast Given 02/08/22 1806)  ticagrelor (BRILINTA) tablet 180 mg (180 mg Oral Given 02/08/22 1846)   stroke: early stages of recovery book ( Does not apply Given 02/08/22 2106)    ED Course/ Medical Decision Making/ A&P                           Medical Decision Making Amount and/or Complexity of Data Reviewed Labs: ordered. Radiology: ordered.  Risk Decision regarding hospitalization.    Broderic Bara is a 51 y.o. male with a past medical history significant for brain aneurysm, hypertension, TIA, and recent stroke who presents with new left facial numbness.  According to patient, his left leg has been weak slightly since his stroke last week but at 4 PM he noticed numbness in his left lower face.  Denies any  facial droop or difficulty speaking but reports the numbness is new and he  is concerned it could be another stroke.  He denies any palpitations, chest pain, shortness of breath.  Denies nausea, vomiting.  Denies vision changes, speech difficulties, or any symptoms in his extremities being new.  On exam, lungs clear.  Chest nontender.  Abdomen nontender.  Left lower leg is weak compared to right which she reports is unchanged from the new baseline.  Sensation normal in extremities.  Patient does have numbness in his left face compared to right but spares the forehead.  Symmetric smile.  Pupils symmetric and reactive with normal extraocular movements.  Spoke to neurology who agreed with activation of a code stroke given his history of recent stroke and these new focal symptoms.  Code stroke started and patient seen by neurology.  They recommend admission to medicine for further management.  The suspect that his carotid stent may be causing embolic phenomena causing these symptoms.  They are going to switch him to [facility] for antiplatelet control and agree with admission.  He will get MRI.  Patient admitted to medicine for further management of stroke.          Final Clinical Impression(s) / ED Diagnoses Final diagnoses:  Cerebrovascular accident (CVA), unspecified mechanism (HCC)  Left facial numbness   Clinical Impression: 1. Cerebrovascular accident (CVA), unspecified mechanism (HCC)   2. Left facial numbness     Disposition: Admit  This note was prepared with assistance of Dragon voice recognition software. Occasional wrong-word or sound-a-like substitutions may have occurred due to the inherent limitations of voice recognition software.      Rakiyah Esch, Canary Brim, MD 02/08/22 [phone]

## 2022-02-08 NOTE — H&P (Cosign Needed)
[facility] Admission History and Physical Service Pager: [phone]  Patient name: Carl Armstrong Medical record number: [id number] Date of birth: 13-Dec-1970 Age: 51 y.o. Gender: male  Primary Care Provider: Patient, No Pcp Per (Inactive) Consultants: Neurology Code Status: Full code  preferred Emergency Contact:  Angela packs (wife) and Fabio Bering packs ([id number]  Chief Complaint: Left facial numbness   Assessment and Plan: Carl Armstrong is a 52 y.o. male presenting with left facial numbness. PMH is significant for Hx of [place] 2017, R ICA aneurysm x2, coiled with subsequent PED placement, HTN, HLD, prediabetes, tobacco use.  Left facial numbness Patient presenting with new left facial numbness after TEE procedure that has since resolved.  CT head showed no acute changes and CT angiogram is unchanged from prior CT a week ago.  Recent echo done today was normal with EF of 55-60%.  On exam patient has symmetrical facial sensation intact, cranial nerves II-XII intact and no focal neurologic deficit other than mild weakness with dorsiflexion of the left foot.  Neurology has been consulted who recommend continuing aspirin and switching Plavix to Brilinta.  Plan for MRI head and we will follow neurology commendations. -Admit to Progressive by [facility], attending Dr. Nori Riis -Neurochecks every 2 hours -Aspirin 81 mg daily -d/cPlavix 75 mg daily -Start Brilinta 90 mg twice daily -Follow-up with MRI head -Continue cardiac monitoring -Monitor BP with routine labs -PT/OT eval and treat -SLP eval and treat -Lovenox for DVT prophylaxis  AKI Patient creatinine on admission was 1.4. 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 Patient denies any headache or vision changes -Neurology  following, appreciate recs  Hypertension Blood pressure on admission ranged from 107-144/78-98.  Home medication include HCTZ 25 mg daily. -Continue home medication -Monitor BP with routine vitals  HLD Most recent lipid panel showed cholesterol of 199, LDL 107, TG 74.  Home medication includes atorvastatin 40 mg daily -LDL goal<70 -Continue home medication  Tobacco use Smokes less than a pack a day for about 20 years -Discussed smoking cessation -Consider nicotine patch  Prediabetics Last A1c 5 days ago was 6.1 -Heart healthy diet   FEN/GI: Heart healthy diet Prophylaxis: Lovenox  Disposition: Progressive  History of Present Illness:  Carl Armstrong is a 51 y.o. male presenting with left facial numbness.  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  Was scheduled to have outpatient TEE as part of his post hospitalization work-up.  After his TEE procedure today around 10 AM patient started noticing new onset left lower facial numbness that started around 4 PM.  At time of encounter patient said that his facial numbness resolved around 7 PM.  He denies any headaches, change in vision, but still endorse some LLE weakness mostly with dorsiflexion of his left foot.  Of note patient reports having multiple TIAs since his cranial aneurysm back in 2017.  Reports usually having about 5 TIAs since then which he he said usually presents as left extremity tingling and numbness.  His most recent left facial numbness lasted for about 3 hours and this is the longest symptom he has had.    Review Of Systems: Per HPI with the following additions:   Review of Systems  Constitutional:  Negative for chills, fatigue  and fever.  HENT:  Negative for ear pain, facial swelling, hearing loss, tinnitus and trouble swallowing.   Eyes:  Negative for photophobia, pain and visual disturbance.  Respiratory:  Negative for chest  tightness, shortness of breath and wheezing.   Cardiovascular:  Negative for chest pain, palpitations and leg swelling.  Neurological:  Positive for weakness (L foot dorsiflexion). Negative for facial asymmetry and light-headedness.    Patient Active Problem List   Diagnosis Date Noted   CVA (cerebral vascular accident) (HCC) 02/08/2022   TIA (transient ischemic attack)    Primary hypertension    Prediabetes    Stroke (HCC) 02/02/2022    Past Medical History: Past Medical History:  Diagnosis Date   Brain aneurysm 2017   Hypertension     Past Surgical History: Past Surgical History:  Procedure Laterality Date   BRAIN SURGERY     IR ANGIO INTRA EXTRACRAN SEL COM CAROTID INNOMINATE BILAT MOD SED  02/05/2022   IR ANGIO VERTEBRAL SEL VERTEBRAL BILAT MOD SED  02/05/2022   IR US GUIDE VASC ACCESS RIGHT  02/05/2022    Social History: Social History   Tobacco Use   Smoking status: Every Day    Types: Cigarettes   Smokeless tobacco: Never  Substance Use Topics   Alcohol use: Not Currently   Drug use: Not Currently   Additional social history:   Please also refer to relevant sections of EMR.  Family History: History reviewed. No pertinent family history.   Allergies and Medications: No Known Allergies No current facility-administered medications on file prior to encounter.   Current Outpatient Medications on File Prior to Encounter  Medication Sig Dispense Refill   aspirin EC 81 MG tablet Take 1 tablet (81 mg total) by mouth daily for 17 days. Swallow whole. 17 tablet 0   atorvastatin (LIPITOR) 40 MG tablet Take 1 tablet (40 mg total) by mouth daily. 30 tablet 0   clopidogrel (PLAVIX) 75 MG tablet Take 1 tablet (75 mg total) by mouth daily. 60 tablet 0   hydrochlorothiazide (HYDRODIURIL) 25 MG tablet Take 25 mg by mouth daily.     potassium chloride SA (KLOR-CON M) 20 MEQ tablet Take 20 mEq by mouth 2 (two) times daily.      Objective: BP 124/90 (BP Location: Right Arm)    Pulse 74   Temp 98.3 F (36.8 C) (Oral)   Resp 16   SpO2 100%  Exam: General: Alert, well appearing, NAD HEENT: Atraumatic, MMM, No sclera icterus CV: RRR, no murmurs, normal S1/S2 Pulm: CTAB, good WOB on RA, no crackles or wheezing Abd: Soft, no distension, no tenderness Skin: dry, warm Ext: 5/5 muscle strength on RUE, LUE, RLE with sensation intact.  Mild weakness with dorsiflexion of the left foot but  Sensation intact Neuro: Alert, oriented x3, cranial nerves II-XII intact   Labs and Imaging: CBC BMET  Recent Labs  Lab 02/08/22 1749 02/08/22 1757  WBC 7.6  --   HGB 13.8 15.6  HCT 43.3 46.0  PLT 324  --    Recent Labs  Lab 02/08/22 1749 02/08/22 1757  NA 139 138  K 3.7 3.9  CL 106 105  CO2 23  --   BUN 15 20  CREATININE 1.37* 1.40*  GLUCOSE 98 94  CALCIUM 9.1  --      EKG: Normal sinus rhythm with no ST changes  ECHO TEE  Result Date: 02/08/2022    TRANSESOPHOGEAL ECHO REPORT   Patient Name:   EDWIN DUDEK Date  of Exam: 02/08/2022 Medical Rec #:  [id number]    Height:       65.0 in Accession #:    [id number]   Weight:       155.0 lb Date of Birth:  Aug 27, 1971    BSA:          1.775 m Patient Age:    24 years     BP:           130/92 mmHg Patient Gender: M            HR:           82 bpm. Exam Location:  Inpatient Procedure: Transesophageal Echo, Cardiac Doppler and Color Doppler                                 MODIFIED REPORT: This report was modified by Skeet Latch MD on 02/08/2022 due to bubble study.  Indications:     Stroke  History:         Patient has prior history of Echocardiogram examinations, most                  recent 02/03/2022. Risk Factors:Hypertension and Current Smoker.  Sonographer:     Clayton Lefort RDCS (AE) Referring Phys:  [id number] Margie Billet Diagnosing Phys: Skeet Latch MD PROCEDURE: After discussion of the risks and benefits of a TEE, an informed consent was obtained from the patient. The transesophogeal probe was passed without difficulty  through the esophogus of the patient. Sedation performed by different physician. The patient was monitored while under deep sedation. Anesthestetic sedation was provided intravenously by Anesthesiology: 205.45mg  of Propofol. Image quality was good. The patient's vital signs; including heart rate, blood pressure, and oxygen saturation; remained stable throughout the procedure. The patient developed no complications during the procedure. IMPRESSIONS  1. Left ventricular ejection fraction, by estimation, is 60 to 65%. The left ventricle has normal function. The left ventricle has no regional wall motion abnormalities.  2. Right ventricular systolic function is normal. The right ventricular size is normal.  3. No left atrial/left atrial appendage thrombus was detected. The LAA emptying velocity was 88 cm/s.  4. The mitral valve is normal in structure. Trivial mitral valve regurgitation. No evidence of mitral stenosis.  5. The aortic valve is tricuspid. Aortic valve regurgitation is not visualized. No aortic stenosis is present.  6. The inferior vena cava is normal in size with greater than 50% respiratory variability, suggesting right atrial pressure of 3 mmHg.  7. Agitated saline contrast bubble study was negative, with no evidence of any interatrial shunt. Conclusion(s)/Recommendation(s): Normal biventricular function without evidence of hemodynamically significant valvular heart disease. FINDINGS  Left Ventricle: Left ventricular ejection fraction, by estimation, is 60 to 65%. The left ventricle has normal function. The left ventricle has no regional wall motion abnormalities. The left ventricular internal cavity size was normal in size. There is  no left ventricular hypertrophy. Right Ventricle: The right ventricular size is normal. No increase in right ventricular wall thickness. Right ventricular systolic function is normal. Left Atrium: Left atrial size was normal in size. No left atrial/left atrial appendage  thrombus was detected. The LAA emptying velocity was 88 cm/s. Right Atrium: Right atrial size was normal in size. Pericardium: There is no evidence of pericardial effusion. Mitral Valve: The mitral valve is normal in structure. Trivial mitral valve regurgitation. No evidence of mitral valve stenosis. Tricuspid Valve: The  tricuspid valve is normal in structure. Tricuspid valve regurgitation is trivial. No evidence of tricuspid stenosis. Aortic Valve: The aortic valve is tricuspid. Aortic valve regurgitation is not visualized. No aortic stenosis is present. Pulmonic Valve: The pulmonic valve was normal in structure. Pulmonic valve regurgitation is trivial. No evidence of pulmonic stenosis. Aorta: The aortic root is normal in size and structure. Venous: The inferior vena cava is normal in size with greater than 50% respiratory variability, suggesting right atrial pressure of 3 mmHg. IAS/Shunts: No atrial level shunt detected by color flow Doppler. Agitated saline contrast was given intravenously to evaluate for intracardiac shunting. Agitated saline contrast bubble study was negative, with no evidence of any interatrial shunt. There  is no evidence of a patent foramen ovale. There is no evidence of an atrial septal defect. Chilton Si MD Electronically signed by Chilton Si MD Signature Date/Time: 02/08/2022/1:35:43 PM    Final (Updated)    CT HEAD CODE STROKE WO CONTRAST  Result Date: 02/08/2022 CLINICAL DATA:  Code stroke. Recent stroke last week, new left facial numbness starting at 4 p.m. EXAM: CT ANGIOGRAPHY HEAD AND NECK TECHNIQUE: Multidetector CT imaging of the head and neck was performed using the standard protocol during bolus administration of intravenous contrast. Multiplanar CT image reconstructions and MIPs were obtained to evaluate the vascular anatomy. Carotid stenosis measurements (when applicable) are obtained utilizing NASCET criteria, using the distal internal carotid diameter as the  denominator. RADIATION DOSE REDUCTION: This exam was performed according to the departmental dose-optimization program which includes automated exposure control, adjustment of the mA and/or kV according to patient size and/or use of iterative reconstruction technique. CONTRAST:  50 cc Omnipaque 350 COMPARISON:  MR head and CT/CTA 02/02/2022 FINDINGS: CT HEAD FINDINGS Brain: Small foci of hypodensity in the right cerebral hemisphere are seen corresponding to previously seen infarcts on the MRI of 02/02/2022. There is no evidence of hemorrhagic transformation of these infarcts There is no evidence of new acute infarct. There is no acute intracranial hemorrhage or extra-axial fluid collection. The ventricles are normal in size. There is no mass lesion. There is no mass effect or midline shift. 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 No dense vessel sign is seen. Skull: Normal. Negative for fracture or focal lesion. Sinuses/Orbits: The paranasal sinuses are clear. Globes and orbits are unremarkable. Other: None. Review of the MIP images confirms the above findings CTA NECK FINDINGS Aortic arch: Aortic arch is normal. The origins of the major branch vessels are patent. The subclavian arteries are patent to the level imaged. Right carotid system: The right common, internal, and external carotid arteries are patent, without hemodynamically significant stenosis or occlusion. There is no dissection or aneurysm. Additionally, no carotid web is identified, as was suspected on the prior catheter angiogram. Left carotid system: Left common, internal, and external carotid arteries are patent, without hemodynamically significant stenosis or occlusion. There is no dissection or aneurysm. Vertebral arteries: The vertebral arteries are patent, without hemodynamically significant stenosis or occlusion. There is no dissection or aneurysm.  Skeleton: There is no acute osseous abnormality or suspicious osseous lesion. There is no visible canal hematoma. Other neck: The soft tissues of the neck are unremarkable. Upper chest: Emphysema is again seen in the lung apices. 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 The stent appears patent,  though evaluation for in stent stenosis is limited by artifact from the stent. There is no definite evidence of recurrent aneurysm. The right M1 segment/communicating ICA is patent distal to the stent. Left intracranial ICA is patent. The bilateral MCAs are patent. The left A1 segment is hypoplastic or absent, likely developmental. Otherwise, the bilateral ACAs are patent There is no new aneurysm.  There is no AVM. Posterior circulation: The bilateral V4 segments are patent. PICA is identified bilaterally. The basilar artery is patent. The left PCA is patent. The proximal right PCA is suboptimally evaluated due to streak artifact from the stent. Within this confine, the right PCA appears patent. The posterior communicating arteries are not identified. There is no aneurysm or AVM. Venous sinuses: There is unchanged asymmetric non opacification of the right cavernous sinus. The venous structures are otherwise patent. Anatomic variants: None. Review of the MIP images confirms the above findings IMPRESSION: 1. Scattered evolving subacute infarcts in the right cerebral hemisphere as seen on the brain MRI of 05/027/2023. No definite new acute infarct. No acute intracranial hemorrhage. This was discussed with Dr. Rory Percy at 6:01 pm. 2. Postprocedural changes reflecting stent mediated coil embolization of a right intracranial ICA aneurysm. The stent appears patent but as before evaluation for in stent stenosis is limited by artifact. This was discussed with Dr Rory Percy at 6:10 pm. 3.  Otherwise, patent vasculature of the head and neck with no hemodynamically significant stenosis or occlusion. Specifically, no carotid web is seen on the right as was suspected on the prior catheter angiogram. Electronically Signed   By: Valetta Mole M.D.   On: 02/08/2022 18:16   CT ANGIO HEAD NECK W WO CM (CODE STROKE)  Result Date: 02/08/2022 CLINICAL DATA:  Code stroke. Recent stroke last week, new left facial numbness starting at 4 p.m. EXAM: CT ANGIOGRAPHY HEAD AND NECK TECHNIQUE: Multidetector CT imaging of the head and neck was performed using the standard protocol during bolus administration of intravenous contrast. Multiplanar CT image reconstructions and MIPs were obtained to evaluate the vascular anatomy. Carotid stenosis measurements (when applicable) are obtained utilizing NASCET criteria, using the distal internal carotid diameter as the denominator. RADIATION DOSE REDUCTION: This exam was performed according to the departmental dose-optimization program which includes automated exposure control, adjustment of the mA and/or kV according to patient size and/or use of iterative reconstruction technique. CONTRAST:  50 cc Omnipaque 350 COMPARISON:  MR head and CT/CTA 02/02/2022 FINDINGS: CT HEAD FINDINGS Brain: Small foci of hypodensity in the right cerebral hemisphere are seen corresponding to previously seen infarcts on the MRI of 02/02/2022. There is no evidence of hemorrhagic transformation of these infarcts There is no evidence of new acute infarct. There is no acute intracranial hemorrhage or extra-axial fluid collection. The ventricles are normal in size. There is no mass lesion. There is no mass effect or midline shift. 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 No dense vessel sign is seen. Skull: Normal. Negative for fracture or focal lesion. Sinuses/Orbits: The paranasal sinuses are clear. Globes and orbits  are unremarkable. Other: None. Review of the MIP images confirms the above findings CTA NECK FINDINGS Aortic arch: Aortic arch is normal. The origins of the major branch vessels are patent. The subclavian arteries are patent to the level imaged. Right carotid system: The right common, internal, and external carotid arteries are patent, without hemodynamically significant stenosis or occlusion. There is no dissection or aneurysm. Additionally, no carotid  web is identified, as was suspected on the prior catheter angiogram. Left carotid system: Left common, internal, and external carotid arteries are patent, without hemodynamically significant stenosis or occlusion. There is no dissection or aneurysm. Vertebral arteries: The vertebral arteries are patent, without hemodynamically significant stenosis or occlusion. There is no dissection or aneurysm. Skeleton: There is no acute osseous abnormality or suspicious osseous lesion. There is no visible canal hematoma. Other neck: The soft tissues of the neck are unremarkable. Upper chest: Emphysema is again seen in the lung apices. 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 The stent appears patent, though evaluation for in stent stenosis is limited by artifact from the stent. There is no definite evidence of recurrent aneurysm. The right M1 segment/communicating ICA is patent distal to the stent. Left intracranial ICA is patent. The bilateral MCAs are patent. The left A1 segment is hypoplastic or absent, likely developmental. Otherwise, the bilateral ACAs are patent There is no new aneurysm.  There is no AVM. Posterior circulation: The bilateral V4 segments are patent. PICA is identified bilaterally. The basilar artery is patent. The left PCA is patent. The proximal right PCA is suboptimally evaluated due to streak artifact from  the stent. Within this confine, the right PCA appears patent. The posterior communicating arteries are not identified. There is no aneurysm or AVM. Venous sinuses: There is unchanged asymmetric non opacification of the right cavernous sinus. The venous structures are otherwise patent. Anatomic variants: None. Review of the MIP images confirms the above findings IMPRESSION: 1. Scattered evolving subacute infarcts in the right cerebral hemisphere as seen on the brain MRI of 05/027/2023. No definite new acute infarct. No acute intracranial hemorrhage. This was discussed with Dr. Rory Percy at 6:01 pm. 2. Postprocedural changes reflecting stent mediated coil embolization of a right intracranial ICA aneurysm. The stent appears patent but as before evaluation for in stent stenosis is limited by artifact. This was discussed with Dr Rory Percy at 6:10 pm. 3. Otherwise, patent vasculature of the head and neck with no hemodynamically significant stenosis or occlusion. Specifically, no carotid web is seen on the right as was suspected on the prior catheter angiogram. Electronically Signed   By: Valetta Mole M.D.   On: 02/08/2022 18:16     Alen Bleacher, MD 02/08/2022, 9:20 PM PGY-1, [facility] pager: [phone], text pages welcome

## 2022-02-08 NOTE — Anesthesia Postprocedure Evaluation (Signed)
Anesthesia Post Note  Patient: Severo Beber  Procedure(s) Performed: TRANSESOPHAGEAL ECHOCARDIOGRAM (TEE) BUBBLE STUDY     Patient location during evaluation: Endoscopy Anesthesia Type: MAC Level of consciousness: awake and alert Pain management: pain level controlled Vital Signs Assessment: post-procedure vital signs reviewed and stable Respiratory status: spontaneous breathing, nonlabored ventilation and respiratory function stable Cardiovascular status: blood pressure returned to baseline and stable Postop Assessment: no apparent nausea or vomiting Anesthetic complications: no   No notable events documented.  Last Vitals:  Vitals:   02/08/22 0840 02/08/22 0845  BP: (!) 125/93 (!) 128/93  Pulse: 76 84  Resp: (!) 24 18  Temp:    SpO2: 100% 100%    Last Pain:  Vitals:   02/08/22 0825  TempSrc:   PainSc: 0-No pain                 Merlinda Frederick

## 2022-02-08 NOTE — Plan of Care (Signed)
See consult note

## 2022-02-08 NOTE — ED Triage Notes (Signed)
Pt BIB [facility] from home d/t stroke like symptoms including L facial numbness and tingling. PT had an ischemic stroke last Friday which resulted in L side weakness, but has recovered up to 80% per pt. But the facial abnormalities are new. Pt's LKW was at 1600 today. A&O X4. VSS.

## 2022-02-08 NOTE — ED Notes (Signed)
Report given by day shift RN, transport requested at this time.

## 2022-02-09 ENCOUNTER — Observation Stay (HOSPITAL_COMMUNITY): Payer: BLUE CROSS/BLUE SHIELD

## 2022-02-09 DIAGNOSIS — R200 Anesthesia of skin: Secondary | ICD-10-CM | POA: Diagnosis not present

## 2022-02-09 DIAGNOSIS — I63511 Cerebral infarction due to unspecified occlusion or stenosis of right middle cerebral artery: Secondary | ICD-10-CM

## 2022-02-09 DIAGNOSIS — Z720 Tobacco use: Secondary | ICD-10-CM

## 2022-02-09 DIAGNOSIS — E782 Mixed hyperlipidemia: Secondary | ICD-10-CM

## 2022-02-09 DIAGNOSIS — I639 Cerebral infarction, unspecified: Secondary | ICD-10-CM | POA: Diagnosis not present

## 2022-02-09 LAB — BASIC METABOLIC PANEL
Anion gap: 9 (ref 5–15)
BUN: 16 mg/dL (ref 6–20)
CO2: 25 mmol/L (ref 22–32)
Calcium: 9.3 mg/dL (ref 8.9–10.3)
Chloride: 103 mmol/L (ref 98–111)
Creatinine, Ser: 1.31 mg/dL — ABNORMAL HIGH (ref 0.61–1.24)
GFR, Estimated: >60 mL/min (ref >60)
Glucose, Bld: 116 mg/dL — ABNORMAL HIGH (ref 70–99)
Potassium: 3.3 mmol/L — ABNORMAL LOW (ref 3.5–5.1)
Sodium: 137 mmol/L (ref 135–145)

## 2022-02-09 IMAGING — MR MR HEAD W/O CM
10 of 11 series · 42 of 48 positions shown · non-contrast
Comparison: Prior brain MRI from [DATE].

CLINICAL DATA: Follow-up examination for acute stroke.

EXAM:
MRI HEAD WITHOUT CONTRAST
TECHNIQUE: Multiplanar, multiecho pulse sequences of the brain and surrounding
structures were obtained without intravenous contrast.

[Series 5: DWI · axial · 3.0mm · 0.88mm/px · z∈[-65,+76]mm · 9 of 96 slices shown (1 of 4)]
[im 1/96]
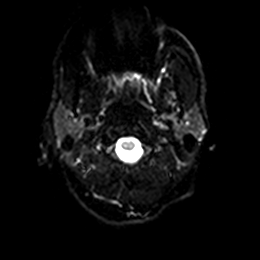
[im 12/96]
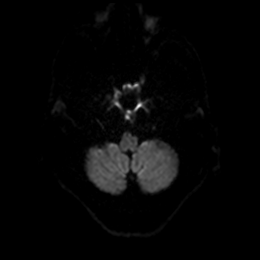
[im 24/96]
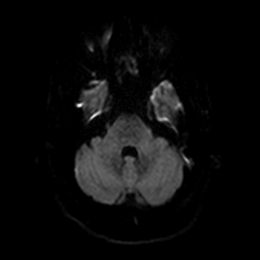
[im 36/96]
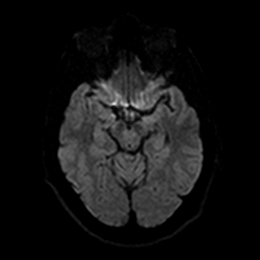
[im 48/96]
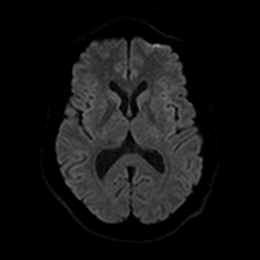
[im 60/96]
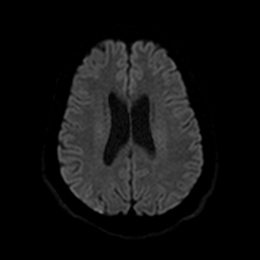
[im 72/96]
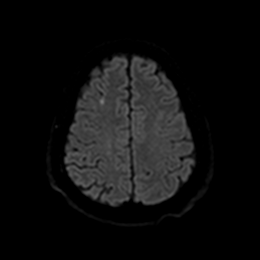
[im 84/96]
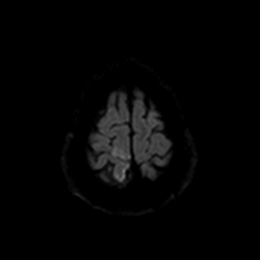
[im 96/96]
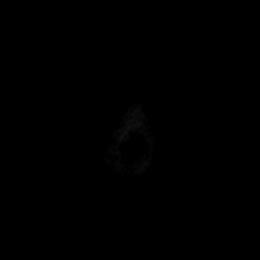

[Series 6: DWI · axial · 3.0mm · 0.88mm/px · z∈[-65,+76]mm · 4 of 47 slices shown (2 of 4)]
[im 1/47]
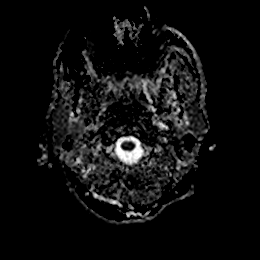
[im 16/47]
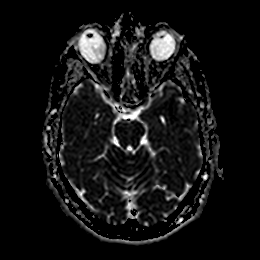
[im 31/47]
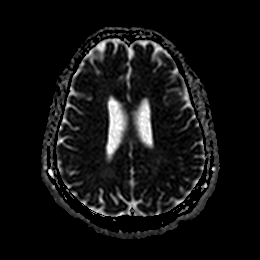
[im 47/47]
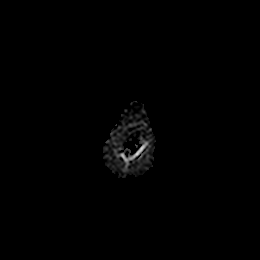

[Series 7: DWI · coronal · 4.0mm · 0.88mm/px · 6 of 64 slices shown (3 of 4)]
[im 1/64]
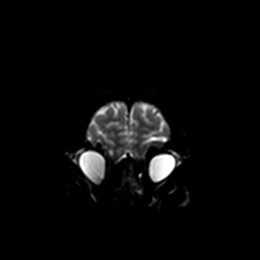
[im 13/64]
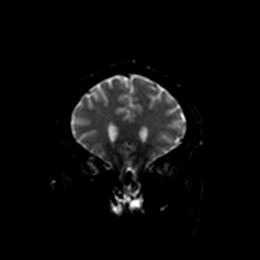
[im 26/64]
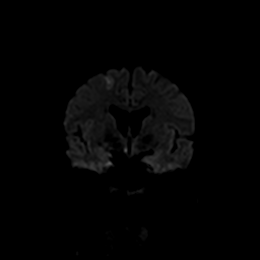
[im 38/64]
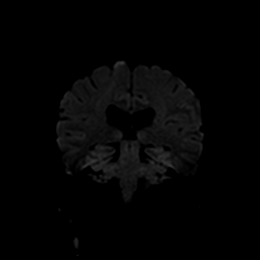
[im 51/64]
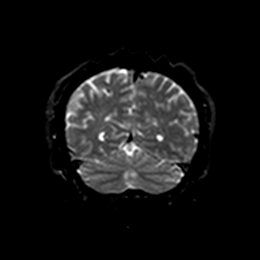
[im 64/64]
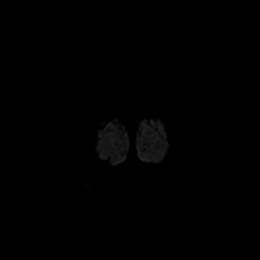

[Series 8: DWI · coronal · 4.0mm · 0.88mm/px · 3 of 32 slices shown (4 of 4)]
[im 1/32]
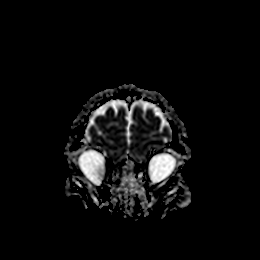
[im 16/32]
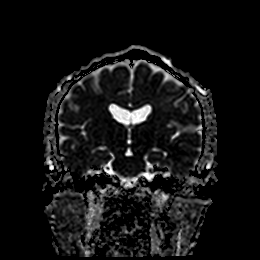
[im 32/32]
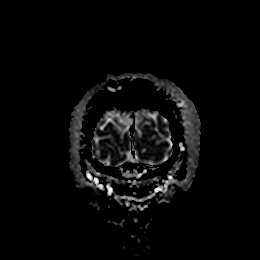

[Series 9: T1 · sagittal · 5.0mm · 0.75mm/px · 2 of 23 slices shown]
[im 1/23]
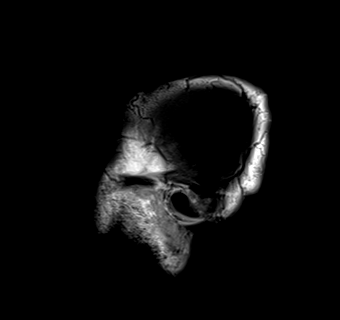
[im 23/23]
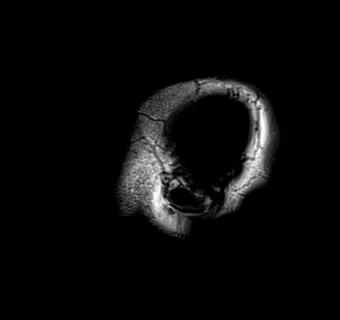

[Series 10: T2 · axial · 5.0mm · 0.72mm/px · z∈[-66,+77]mm · 2 of 25 slices shown (1 of 2)]
[im 1/25]
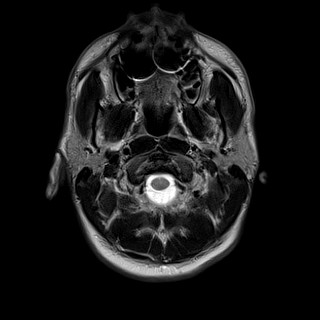
[im 25/25]
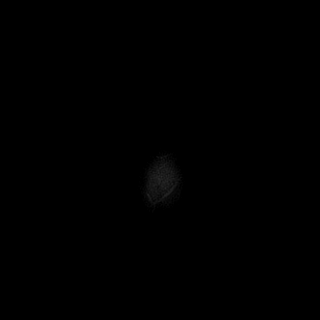

[Series 11: FLAIR · axial · 5.0mm · 0.45mm/px · z∈[-65,+79]mm · 2 of 25 slices shown]
[im 1/25]
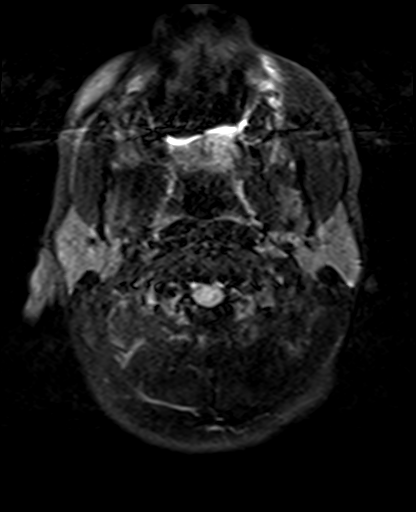
[im 25/25]
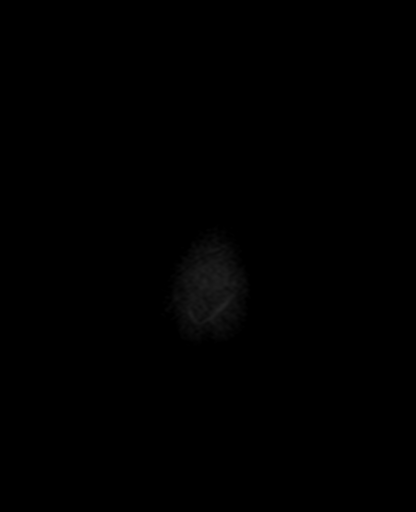

[Series 13: pha_images · axial · 3.0mm · 0.90mm/px · z∈[-81,+83]mm · 5 of 56 slices shown]
[im 1/56]
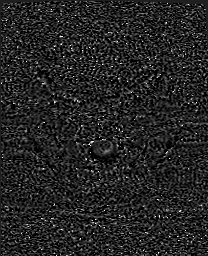
[im 14/56]
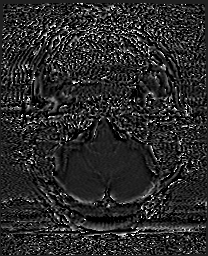
[im 28/56]
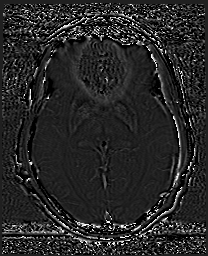
[im 42/56]
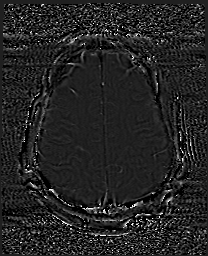
[im 56/56]
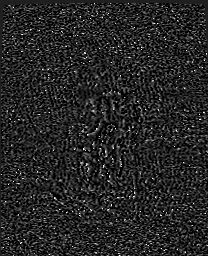

[Series 14: swi_images · axial · 3.0mm · 0.90mm/px · z∈[-81,+95]mm · 6 of 60 slices shown]
[im 1/60]
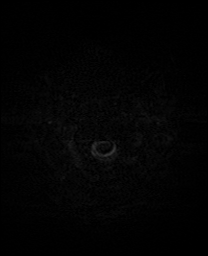
[im 12/60]
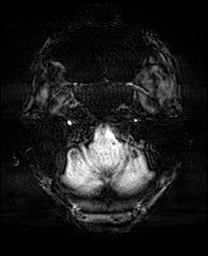
[im 24/60]
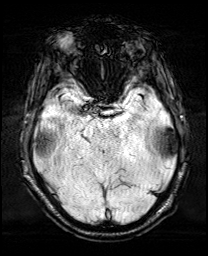
[im 36/60]
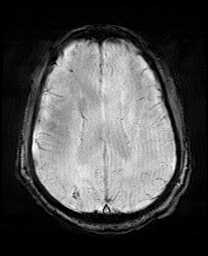
[im 48/60]
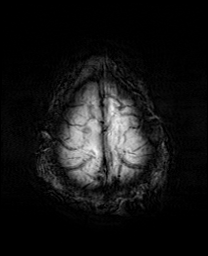
[im 60/60]
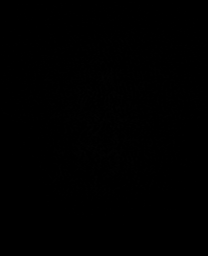

[Series 17: T2 · coronal · 5.0mm · 0.34mm/px · 3 of 29 slices shown (2 of 2)]
[im 1/29]
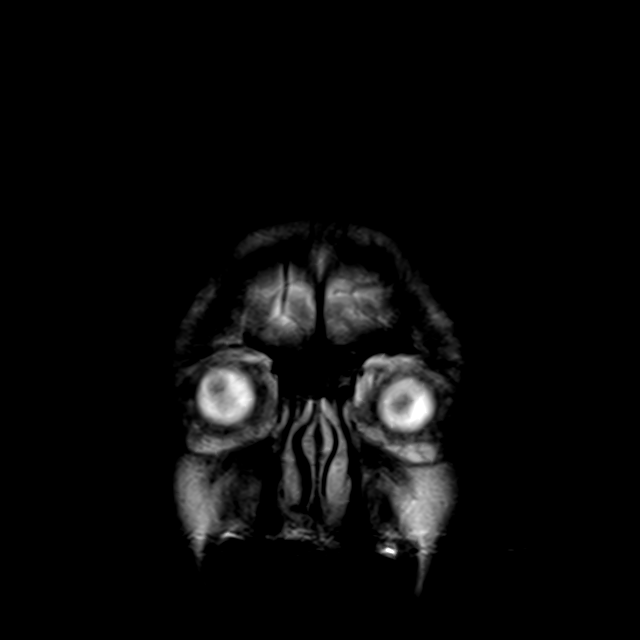
[im 15/29]
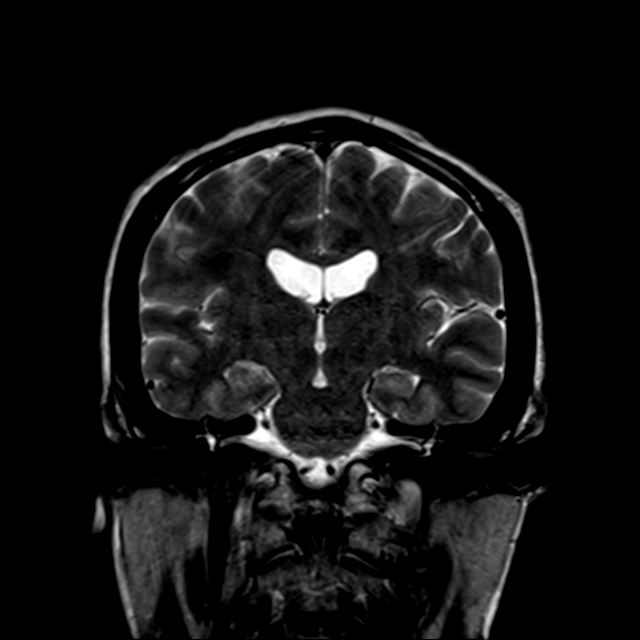
[im 29/29]
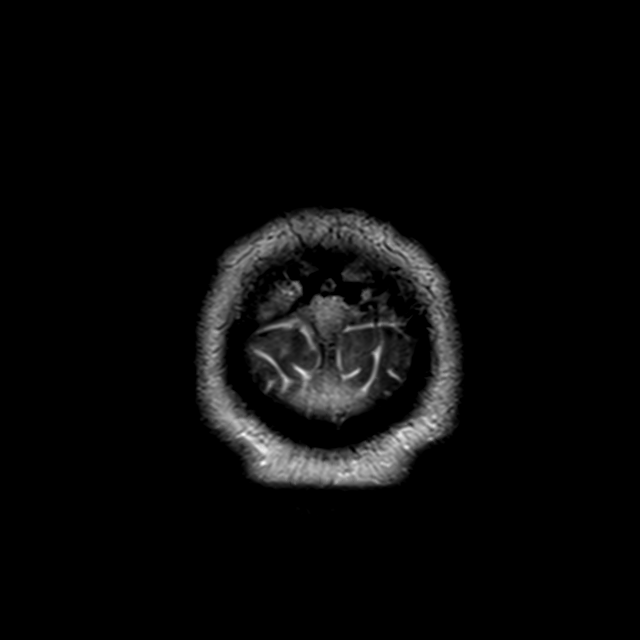

[42 of 48 positions shown; findings below may reference images not displayed]

FINDINGS: Brain: Continued interval evolution of previously identified
scattered right MCA territory infarcts. Again, these are watershed
in distribution. Overall appearance is decreased from prior with
diminished diffusion signal abnormality. Associated scattered small
volume petechial blood products at the right parieto-occipital
region without hemorrhagic transformation or significant regional
mass effect Heidelberg classification 1a: HI1, scattered small
petechiae, no mass effect. No evidence for new or interval
infarction.

Mild scattered nonspecific T2/FLAIR hyperintensities noted within
the periventricular white matter, stable. No mass lesion, mass
effect or midline shift. No hydrocephalus or extra-axial fluid
collection. No other new or progressive finding.

Vascular: Major intracranial vascular flow voids are maintained.

Skull and upper cervical spine: Craniocervical junction within
normal limits. Bone marrow signal intensity within normal limits
without focal marrow replacing lesion. No scalp soft tissue
abnormality.

Sinuses/Orbits: Globes orbital soft tissues within normal limits.
Paranasal sinuses remain largely clear. No significant mastoid
effusion.

Other: None.
IMPRESSION: 1. Continued interval evolution of previously identified watershed
distribution right MCA infarcts. Associated small volume petechial
blood products without hemorrhagic transformation or significant
mass effect.
2. No new infarct or other abnormality.

## 2022-02-09 MED ORDER — POTASSIUM CHLORIDE CRYS ER 20 MEQ PO TBCR
20.0000 meq | EXTENDED_RELEASE_TABLET | Freq: Once | ORAL | Status: AC
Start: 2022-02-09 — End: 2022-02-09
  Administered 2022-02-09: 20 meq via ORAL
  Filled 2022-02-09: qty 1

## 2022-02-09 MED ORDER — TICAGRELOR 90 MG PO TABS: 90.0000 mg | ORAL_TABLET | Freq: Two times a day (BID) | ORAL | 0 refills | Status: DC

## 2022-02-09 NOTE — Progress Notes (Addendum)
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  Repeat MRI scan shows no new infarcts but patient's symptoms were transient and have resolved completely.  Patient is unwilling to stay and get loop recorder on Monday as he prefers having his work-up done and neurology area where he has had rest of his medical care..  Recommend following up with PCP for a referral to cardiology with [facility] for a possible loop recorder placement. He states that back to normal self and he is ready to be discharged.   Vitals:   02/08/22 1940 02/08/22 2312 02/09/22 0355 02/09/22 0722  BP: 124/90 131/88 104/77 114/73  Pulse: 74 76 83 73  Resp: 16 16 16 14   Temp: 98.3 F (36.8 C) 98.6 F (37 C) (!) 97.5 F (36.4 C) 98.3 F (36.8 C)  TempSrc: Oral Oral Oral Oral  SpO2: 100% 100% 99% 99%   CBC:  Recent Labs  Lab 02/03/22 0055 02/08/22 1749 02/08/22 1757  WBC 10.3 7.6  --   NEUTROABS  --  4.5  --   HGB 13.3 13.8 15.6  HCT 41.9 43.3 46.0  MCV 73.8* 75.2*  --   PLT 397 324  --    Basic Metabolic Panel:  Recent Labs  Lab 02/08/22 1749 02/08/22 1757 02/09/22 0050  NA 139 138 137  K 3.7 3.9 3.3*  CL 106 105 103  CO2 23  --  25  GLUCOSE 98 94 116*  BUN 15 20 16   CREATININE 1.37* 1.40* 1.31*  CALCIUM 9.1  --  9.3   Lipid Panel:  Recent Labs  Lab 02/03/22 0055  CHOL 193  TRIG 120  HDL 46  CHOLHDL 4.2  VLDL 24  LDLCALC 914123*   HgbA1c:  Recent Labs  Lab 02/03/22 0055  HGBA1C 6.1*   Urine Drug Screen:  Recent Labs  Lab 02/08/22 1903  LABOPIA NONE DETECTED  COCAINSCRNUR NONE DETECTED  LABBENZ NONE DETECTED  AMPHETMU NONE DETECTED  THCU NONE DETECTED  LABBARB NONE DETECTED    Alcohol Level  Recent Labs  Lab 02/08/22 1749  ETH <10    IMAGING past 24 hours MR BRAIN WO  CONTRAST  Result Date: 02/09/2022 CLINICAL DATA:  Follow-up examination for acute stroke. EXAM: MRI HEAD WITHOUT CONTRAST TECHNIQUE: Multiplanar, multiecho pulse sequences of the brain and surrounding structures were obtained without intravenous contrast. COMPARISON:  Prior brain MRI from 02/02/2022. FINDINGS: Brain: Continued interval evolution of previously identified scattered right MCA territory infarcts. Again, these are watershed in distribution. Overall appearance is decreased from prior with diminished diffusion signal abnormality. Associated scattered small volume petechial blood products at the right parieto-occipital region without hemorrhagic transformation or significant regional mass effect Heidelberg classification 1a: HI1, scattered small petechiae, no mass effect. No evidence for new or interval infarction. Mild scattered nonspecific T2/FLAIR hyperintensities noted within the periventricular white matter, stable. No mass lesion, mass effect or midline shift. No hydrocephalus or extra-axial fluid collection. No other new or progressive finding. Vascular: Major intracranial vascular flow voids are maintained. Skull and upper cervical spine: Craniocervical junction within normal limits. Bone marrow signal intensity within normal limits without focal marrow replacing lesion. No scalp soft tissue abnormality. Sinuses/Orbits: Globes orbital soft tissues within normal limits. Paranasal sinuses remain largely clear. No significant mastoid effusion. Other: None. IMPRESSION: 1. Continued interval  evolution of previously identified watershed distribution right MCA infarcts. Associated small volume petechial blood products without hemorrhagic transformation or significant mass effect. 2. No new infarct or other abnormality. Electronically Signed   By: Rise Mu M.D.   On: 02/09/2022 04:30   CT HEAD CODE STROKE WO CONTRAST  Result Date: 02/08/2022 CLINICAL DATA:  Code stroke. Recent stroke last  week, new left facial numbness starting at 4 p.m. EXAM: CT ANGIOGRAPHY HEAD AND NECK TECHNIQUE: Multidetector CT imaging of the head and neck was performed using the standard protocol during bolus administration of intravenous contrast. Multiplanar CT image reconstructions and MIPs were obtained to evaluate the vascular anatomy. Carotid stenosis measurements (when applicable) are obtained utilizing NASCET criteria, using the distal internal carotid diameter as the denominator. RADIATION DOSE REDUCTION: This exam was performed according to the departmental dose-optimization program which includes automated exposure control, adjustment of the mA and/or kV according to patient size and/or use of iterative reconstruction technique. CONTRAST:  50 cc Omnipaque 350 COMPARISON:  MR head and CT/CTA 02/02/2022 FINDINGS: CT HEAD FINDINGS Brain: Small foci of hypodensity in the right cerebral hemisphere are seen corresponding to previously seen infarcts on the MRI of 02/02/2022. There is no evidence of hemorrhagic transformation of these infarcts There is no evidence of new acute infarct. There is no acute intracranial hemorrhage or extra-axial fluid collection. The ventricles are normal in size. There is no mass lesion. There is no mass effect or midline shift. 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 No dense vessel sign is seen. Skull: Normal. Negative for fracture or focal lesion. Sinuses/Orbits: The paranasal sinuses are clear. Globes and orbits are unremarkable. Other: None. Review of the MIP images confirms the above findings CTA NECK FINDINGS Aortic arch: Aortic arch is normal. The origins of the major branch vessels are patent. The subclavian arteries are patent to the level imaged. Right carotid system: The right common, internal, and external carotid arteries are patent, without hemodynamically significant stenosis or occlusion.  There is no dissection or aneurysm. Additionally, no carotid web is identified, as was suspected on the prior catheter angiogram. Left carotid system: Left common, internal, and external carotid arteries are patent, without hemodynamically significant stenosis or occlusion. There is no dissection or aneurysm. Vertebral arteries: The vertebral arteries are patent, without hemodynamically significant stenosis or occlusion. There is no dissection or aneurysm. Skeleton: There is no acute osseous abnormality or suspicious osseous lesion. There is no visible canal hematoma. Other neck: The soft tissues of the neck are unremarkable. Upper chest: Emphysema is again seen in the lung apices. 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 The stent appears patent, though evaluation for in stent stenosis is limited by artifact from the stent. There is no definite evidence of recurrent aneurysm. The right M1 segment/communicating ICA is patent distal to the stent. Left intracranial ICA is patent. The bilateral MCAs are patent. The left A1 segment is hypoplastic or absent, likely developmental. Otherwise, the bilateral ACAs are patent There is no new aneurysm.  There is no AVM. Posterior circulation: The bilateral V4 segments are patent. PICA is identified bilaterally. The basilar artery is patent. The left PCA is patent. The proximal right PCA is suboptimally evaluated due to streak artifact from the stent. Within this confine, the right PCA appears patent. The posterior communicating  arteries are not identified. There is no aneurysm or AVM. Venous sinuses: There is unchanged asymmetric non opacification of the right cavernous sinus. The venous structures are otherwise patent. Anatomic variants: None. Review of the MIP images confirms the above findings IMPRESSION: 1. Scattered  evolving subacute infarcts in the right cerebral hemisphere as seen on the brain MRI of 05/027/2023. No definite new acute infarct. No acute intracranial hemorrhage. This was discussed with Dr. Wilford Corner at 6:01 pm. 2. Postprocedural changes reflecting stent mediated coil embolization of a right intracranial ICA aneurysm. The stent appears patent but as before evaluation for in stent stenosis is limited by artifact. This was discussed with Dr Wilford Corner at 6:10 pm. 3. Otherwise, patent vasculature of the head and neck with no hemodynamically significant stenosis or occlusion. Specifically, no carotid web is seen on the right as was suspected on the prior catheter angiogram. Electronically Signed   By: Lesia Hausen M.D.   On: 02/08/2022 18:16   CT ANGIO HEAD NECK W WO CM (CODE STROKE)  Result Date: 02/08/2022 CLINICAL DATA:  Code stroke. Recent stroke last week, new left facial numbness starting at 4 p.m. EXAM: CT ANGIOGRAPHY HEAD AND NECK TECHNIQUE: Multidetector CT imaging of the head and neck was performed using the standard protocol during bolus administration of intravenous contrast. Multiplanar CT image reconstructions and MIPs were obtained to evaluate the vascular anatomy. Carotid stenosis measurements (when applicable) are obtained utilizing NASCET criteria, using the distal internal carotid diameter as the denominator. RADIATION DOSE REDUCTION: This exam was performed according to the departmental dose-optimization program which includes automated exposure control, adjustment of the mA and/or kV according to patient size and/or use of iterative reconstruction technique. CONTRAST:  50 cc Omnipaque 350 COMPARISON:  MR head and CT/CTA 02/02/2022 FINDINGS: CT HEAD FINDINGS Brain: Small foci of hypodensity in the right cerebral hemisphere are seen corresponding to previously seen infarcts on the MRI of 02/02/2022. There is no evidence of hemorrhagic transformation of these infarcts There is no evidence of new acute  infarct. There is no acute intracranial hemorrhage or extra-axial fluid collection. The ventricles are normal in size. There is no mass lesion. There is no mass effect or midline shift. 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 No dense vessel sign is seen. Skull: Normal. Negative for fracture or focal lesion. Sinuses/Orbits: The paranasal sinuses are clear. Globes and orbits are unremarkable. Other: None. Review of the MIP images confirms the above findings CTA NECK FINDINGS Aortic arch: Aortic arch is normal. The origins of the major branch vessels are patent. The subclavian arteries are patent to the level imaged. Right carotid system: The right common, internal, and external carotid arteries are patent, without hemodynamically significant stenosis or occlusion. There is no dissection or aneurysm. Additionally, no carotid web is identified, as was suspected on the prior catheter angiogram. Left carotid system: Left common, internal, and external carotid arteries are patent, without hemodynamically significant stenosis or occlusion. There is no dissection or aneurysm. Vertebral arteries: The vertebral arteries are patent, without hemodynamically significant stenosis or occlusion. There is no dissection or aneurysm. Skeleton: There is no acute osseous abnormality or suspicious osseous lesion. There is no visible canal hematoma. Other neck: The soft tissues of the neck are unremarkable. Upper chest: Emphysema is again seen in the lung apices. Review of the MIP images confirms the above findings CTA HEAD FINDINGS Anterior circulation: The patient is status post stent mediated coil embolization of  the right intracranial ICA aneurysm. The stent appears patent, though evaluation for in stent stenosis is limited by artifact from the stent. There is no definite evidence of recurrent aneurysm. The right M1 segment/communicating ICA is  patent distal to the stent. Left intracranial ICA is patent. The bilateral MCAs are patent. The left A1 segment is hypoplastic or absent, likely developmental. Otherwise, the bilateral ACAs are patent There is no new aneurysm.  There is no AVM. Posterior circulation: The bilateral V4 segments are patent. PICA is identified bilaterally. The basilar artery is patent. The left PCA is patent. The proximal right PCA is suboptimally evaluated due to streak artifact from the stent. Within this confine, the right PCA appears patent. The posterior communicating arteries are not identified. There is no aneurysm or AVM. Venous sinuses: There is unchanged asymmetric non opacification of the right cavernous sinus. The venous structures are otherwise patent. Anatomic variants: None. Review of the MIP images confirms the above findings IMPRESSION: 1. Scattered evolving subacute infarcts in the right cerebral hemisphere as seen on the brain MRI of 05/027/2023. No definite new acute infarct. No acute intracranial hemorrhage. This was discussed with Dr. Wilford Corner at 6:01 pm. 2. Postprocedural changes reflecting stent mediated coil embolization of a right intracranial ICA aneurysm. The stent appears patent but as before evaluation for in stent stenosis is limited by artifact. This was discussed with Dr Wilford Corner at 6:10 pm. 3. Otherwise, patent vasculature of the head and neck with no hemodynamically significant stenosis or occlusion. Specifically, no carotid web is seen on the right as was suspected on the prior catheter angiogram. Electronically Signed   By: Lesia Hausen M.D.   On: 02/08/2022 18:16    PHYSICAL EXAM  Physical Exam  Constitutional: Appears well-developed and well-nourished middle-aged African-American male.  Cardiovascular: Normal rate and regular rhythm.  Respiratory: Effort normal, non-labored breathing  Neuro: Mental Status: Patient is awake, alert, oriented to person, place, month, year, and situation. Patient  is able to give a clear and coherent history. No signs of aphasia or neglect Cranial Nerves: II: Visual Fields are full. Pupils are equal, round, and reactive to light.   III,IV, VI: EOMI without ptosis or diploplia.  V: Facial sensation is symmetric to temperature VII: Facial movement is symmetric resting and smiling VIII: Hearing is intact to voice X: Palate elevates symmetrically XI: Shoulder shrug is symmetric. XII: Tongue protrudes midline without atrophy or fasciculations.  Motor: Tone is normal. Bulk is normal. 5/5 strength was present in all four extremities.  Sensory: Sensation is symmetric to light touch and temperature in the arms and legs. No extinction to DSS present.  Cerebellar: FNF and HKS are intact bilaterally    ASSESSMENT/PLAN Mr. Carl Armstrong is a 51 y.o. 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 He had an outpatient TEE on 6/2 at 0800 and he was home by 1030. Around 1600 he noticed numbness and tingling in his left face and came to the ED as a code stroke. In conversation today he denies left leg weakness and states his left facial numbness has resolved.   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  CTA head & neck stable in comparison to previous CTA MRI  Continued interval evolution of previously identified watershed distribution right MCA infarcts. No new infarct. TEE- EF 60-65%,  bubble study negative LDL 123 HgbA1c 6.1 VTE prophylaxis - Lovenox    Diet   Diet Heart Room service appropriate? Yes; Fluid consistency: Thin   aspirin 81 mg daily and clopidogrel 75 mg daily prior to admission, now on aspirin 81 mg daily and Brilinta (ticagrelor) 90 mg bid.  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 MD to update note as needed.   , DNP, FNP-BC [facility] Pager: [phone]  I have personally obtained history,examined this patient, reviewed notes, independently viewed imaging studies, participated in medical decision making and plan of care.ROS completed by me personally and pertinent positives fully documented  I have made any additions or clarifications directly to the above note. Agree with note above.  Patient presented yesterday with left face numbness and heaviness which was transient and this occurred several hours after he had gone home after an outpatient TEE.  MRI scan shows recent right hemispheric embolic infarcts without any new additional finding.  Aspirin and Plavix have been changed to aspirin and Brilinta recommend he continue this for 4 weeks and then aspirin alone.  I have recommended a loop recorder for paroxysmal A-fib but patient does not want to have this done here and states he will seek a referral from his primary care physician in the [place] area where he lives to have it done there by cardiology.  Greater than 50% time during this 50-minute visit was spent on counseling and coordination of care about his TIA and recent stroke and discussion  about cardiac monitoring and stroke prevention and answering questions.  Stroke team will sign off.  Kindly call for questions  [phone], MD Medical Director [facility] Pager: [phone] 02/09/2022 1:01 PM   To contact Stroke Continuity provider, please refer to [phone]. After hours, contact General Neurology

## 2022-02-09 NOTE — TOC Transition Note (Signed)
Transition of Care [facility]) - CM/SW Discharge Note   Patient Details  Name: Carl Armstrong MRN: [id number] Date of Birth: 03-29-1971  [facility] CM/SW Contact:  Carles Collet, RN Phone Number: 02/09/2022, 2:50 PM   Clinical Narrative:   Provided patient with 30 day and copay reduction cards for Brilinta. He states that he will have commercial insurance by 6/12 to be able to use for refills. Patient has follow up appointment on AVS. No other TOC needs identified.           Patient Goals and CMS Choice        Discharge Placement                       Discharge Plan and Services                                     Social Determinants of Health (SDOH) Interventions     Readmission Risk Interventions     View : No data to display.

## 2022-02-09 NOTE — Evaluation (Signed)
Occupational Therapy Evaluation Patient Details Name: Carl Armstrong MRN: [id number] DOB: [birth year] Today's Date: 02/09/2022   History of Present Illness Pt is a 51 y/o M presenting with L facial numbness/tingling, recently admitted for ischemic stroke with L weakness. MRI revealing continued interval evolution of previous watershed R MCA infarcts in R parieto-occipital region. 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 Pt curerntly mod I for ADLs, independent with ADLs/ bed mobility. Pt able to walk hallway distance ambulation, follows  multistep commands without difficulty. Educated on BEFAST, pt is able to recall most of BEFAST acronym after ~5 minutes. Pt presenting with impairments listed below, however has no acute OT needs at this time, will s/o. Recommend d/c home with family assistance.     Recommendations for follow up therapy are one component of a multi-disciplinary discharge planning process, led by the attending physician.  Recommendations may be updated based on patient status, additional functional criteria and insurance authorization.   Follow Up Recommendations  No OT follow up    Assistance Recommended at Discharge Intermittent Supervision/Assistance  Patient can return home with the following A little help with walking and/or transfers;Help with stairs or ramp for entrance;Assist for transportation;Assistance with cooking/housework    Functional Status Assessment  Patient has had a recent decline in their functional status and demonstrates the ability to make significant improvements in function in a reasonable and predictable amount of time.  Equipment Recommendations  None recommended by OT    Recommendations for Other Services       Precautions / Restrictions Precautions Precautions: Fall Restrictions Weight Bearing Restrictions: No       Mobility Bed Mobility Overal bed mobility: Independent             General bed mobility comments: no bed rail use    Transfers Overall transfer level: Independent Equipment used: None               General transfer comment: no AD use      Balance Overall balance assessment: No apparent balance deficits (not formally assessed)                                         ADL either performed or assessed with clinical judgement   ADL Overall ADL's : Modified independent                                       General ADL Comments: dresses self mod I in sitting/standing; walks hallway distance ambulation independently     Vision Baseline Vision/History: 1 Wears glasses Ability to See in Adequate Light: 0 Adequate Patient Visual Report: No change from baseline Vision Assessment?: No apparent visual deficits Additional Comments: vision WFL for tasks assessed     Perception     Praxis      Pertinent Vitals/Pain Pain Assessment Pain Assessment: No/denies pain     Hand Dominance     Extremity/Trunk Assessment Upper Extremity Assessment Upper Extremity Assessment: Overall WFL for tasks assessed   Lower Extremity Assessment Lower Extremity Assessment: Defer to PT evaluation   Cervical / Trunk Assessment Cervical / Trunk Assessment: Normal   Communication Communication Communication: No difficulties   Cognition Arousal/Alertness: Awake/alert Behavior  During Therapy: WFL for tasks assessed/performed Overall Cognitive Status: Within Functional Limits for tasks assessed                                 General Comments: able to recall majority of BEFAST acronym after ~5 mins     General Comments  VSS on RA    Exercises     Shoulder Instructions      Home Living Family/patient expects to be discharged to:: Private residence Living Arrangements: Spouse/significant other Available Help at Discharge:  Family;Available PRN/intermittently Type of Home: House Home Access: Stairs to enter [employer] of Steps: 2-3 Entrance Stairs-Rails: Right Home Layout: One level     Bathroom Shower/Tub: IT trainer: Standard     Home Equipment: None          Prior Functioning/Environment Prior Level of Function : Independent/Modified Independent             Mobility Comments: no AD use, reports slight limp ADLs Comments: endorses driving and IADLs, not working        OT Problem List: Impaired balance (sitting and/or standing);Decreased activity tolerance;Decreased strength;Decreased safety awareness      OT Treatment/Interventions:      OT Goals(Current goals can be found in the care plan section) Acute Rehab OT Goals Patient Stated Goal: to go home OT Goal Formulation: With patient Time For Goal Achievement: 02/23/22 Potential to Achieve Goals: Good  OT Frequency:      Co-evaluation              AM-PAC OT "6 Clicks" Daily Activity     Outcome Measure Help from another person eating meals?: None Help from another person taking care of personal grooming?: None Help from another person toileting, which includes using toliet, bedpan, or urinal?: None Help from another person bathing (including washing, rinsing, drying)?: A Little Help from another person to put on and taking off regular upper body clothing?: None Help from another person to put on and taking off regular lower body clothing?: None 6 Click Score: 23   End of Session Equipment Utilized During Treatment: Gait belt Nurse Communication: Mobility status  Activity Tolerance: Patient tolerated treatment well Patient left: in bed;with call bell/phone within reach;with bed alarm set  OT Visit Diagnosis: Unsteadiness on feet (R26.81);Other abnormalities of gait and mobility (R26.89);Muscle weakness (generalized) (M62.81)                Time: 9622-2979 OT Time  Calculation (min): 14 min Charges:  OT General Charges $OT Visit: 1 Visit OT Evaluation $OT Eval Low Complexity: 1 Low  Alfonzo Beers, OTD, OTR/L Acute Rehab [phone]   Mayer Masker 02/09/2022, 10:59 AM

## 2022-02-09 NOTE — Discharge Summary (Signed)
Family Medicine [facility] Discharge Summary  Patient name: Carl Armstrong Medical record number: [id number] Date of birth: 25-Jun-1971 Age: 51 y.o. Gender: male Date of Admission: 02/08/2022  Date of Discharge: 02/09/2022  Admitting Physician: Anselm Jungling, DO  Primary Care Provider: Patient, No Pcp Per (Inactive) Consultants: neurology  Indication for Hospitalization: left facial numbness  Discharge Diagnoses/Problem List:  Principal Problem:   CVA (cerebral vascular accident) [facility]) Active Problems:   Left facial numbness   Mixed hyperlipidemia   Tobacco abuse    Disposition: home  Discharge Condition: Stable  Discharge Exam:  Exam per Dr. Elliot Gurney General: Alert, well appearing, NAD CV: RRR, no murmurs, normal S1/S2 Pulm: CTAB, good WOB on RA, no crackles or wheezing Abd: Soft, no distension, no tenderness Ext: No BLE edema, diminished dorsiflexion of left foot with diminished sensation  Neuro: CN II-XII intact   Brief Hospital Course:  [name] is a 51 y.o. male with history of SAH 2017, R ICA aneurysm x2, coiled with subsequent PED placement, HTN, HLD, prediabetes, tobacco use who was admitted to the [facility] at [facility] for left facial numbness. Hospital course is outlined below by system.    Left facial numbness Patient presenting with new left facial numbness after TEE procedure that has since resolved.  CT head, CT angiogram and Brain MRI show no acute change. Patient on exam still have residual weakness with dorsiflexion of his left foot with diminished sensation. Was discharged last with with Aspirin and Plavix. However given new symptom switched Plavix to Brilinta 90 mg BID per neurology.    PCP follow-up recommendations Given new symptom Plavix was discontinue and switched to Brilinta 90 mg BID.       Significant Procedures: none  Significant Labs and Imaging:  Recent Labs  Lab 02/03/22 0055 02/08/22 1749 02/08/22 1757   WBC 10.3 7.6  --   HGB 13.3 13.8 15.6  HCT 41.9 43.3 46.0  PLT 397 324  --    Recent Labs  Lab 02/03/22 0055 02/04/22 0109 02/05/22 0640 02/08/22 1749 02/08/22 1757 02/09/22 0050  NA 138 139 135 139 138 137  K 3.1* 3.9 3.9 3.7 3.9 3.3*  CL 103 107 107 106 105 103  CO2 26 25 23 23   --  25  GLUCOSE 122* 83 87 98 94 116*  BUN 14 13 15 15 20 16   CREATININE 1.37* 1.56* 1.29* 1.37* 1.40* 1.31*  CALCIUM 9.2 8.9 9.2 9.1  --  9.3  ALKPHOS  --   --   --  73  --   --   AST  --   --   --  29  --   --   ALT  --   --   --  26  --   --   ALBUMIN  --   --   --  4.2  --   --       Results/Tests Pending at Time of Discharge: none  Discharge Medications:  Allergies as of 02/09/2022   No Known Allergies      Medication List     STOP taking these medications    clopidogrel 75 MG tablet Commonly known as: PLAVIX       TAKE these medications    aspirin EC 81 MG tablet Take 1 tablet (81 mg total) by mouth daily for 17 days. Swallow whole.   atorvastatin 40 MG tablet Commonly known as: LIPITOR Take 1 tablet (40 mg total) by mouth daily.  hydrochlorothiazide 25 MG tablet Commonly known as: HYDRODIURIL Take 25 mg by mouth daily.   potassium chloride SA 20 MEQ tablet Commonly known as: KLOR-CON M Take 20 mEq by mouth 2 (two) times daily.   ticagrelor 90 MG Tabs tablet Commonly known as: BRILINTA Take 1 tablet (90 mg total) by mouth 2 (two) times daily.        Discharge Instructions: Please refer to Patient Instructions section of EMR for full details.  Patient was counseled important signs and symptoms that should prompt return to medical care, changes in medications, dietary instructions, activity restrictions, and follow up appointments.   Follow-Up Appointments:   Littie Deeds, MD 02/09/2022, 2:05 PM PGY-2, [facility]

## 2022-02-09 NOTE — Hospital Course (Addendum)
Carl Armstrong is a 51 y.o. male with history of SAH 2017, R ICA aneurysm x2, coiled with subsequent PED placement, HTN, HLD, prediabetes, tobacco use who was admitted to the [facility] at [facility] for left facial numbness. Hospital course is outlined below by system.    Left facial numbness Patient presenting with new left facial numbness after TEE procedure that has since resolved.  CT head, CT angiogram and Brain MRI show no acute change. Patient on exam still have residual weakness with dorsiflexion of his left foot with diminished sensation. Was discharged last with with Aspirin and Plavix. However given new symptom switched Plavix to Brilinta 90 mg BID per neurology.    PCP follow-up recommendations Given new symptom Plavix was discontinue and switched to Brilinta 90 mg BID.

## 2022-02-09 NOTE — Progress Notes (Signed)
Physical Therapy Note  Spoke with occupational therapy after initial evaluation. OT reports patient is functioning at a high level of independence and no physical therapy is indicated at this time. Spoke with patient personally and reports he feels back to baseline and declines PT services. PT is signing-off. Please re-order if there is any significant change in status. Thank you for this referral.  Candie Mile, PT

## 2022-02-09 NOTE — Plan of Care (Signed)
Problem: Education: Goal: Knowledge of disease or condition will improve Outcome: Progressing Goal: Knowledge of secondary prevention will improve (SELECT ALL) Outcome: Progressing Goal: Knowledge of patient specific risk factors will improve (INDIVIDUALIZE FOR PATIENT) Outcome: Progressing   Problem: Self-Care: Goal: Verbalization of feelings and concerns over difficulty with self-care will improve Outcome: Progressing Goal: Ability to communicate needs accurately will improve Outcome: Progressing   Problem: Ischemic Stroke/TIA Tissue Perfusion: Goal: Complications of ischemic stroke/TIA will be minimized Outcome: Progressing

## 2022-02-09 NOTE — Progress Notes (Addendum)
Family Medicine Teaching Service Daily Progress Note Intern Pager: [phone]  Patient name: Carl Armstrong Medical record number: [id number] Date of birth: Feb 03, 1971 Age: 51 y.o. Gender: male  Primary Care Provider: Patient, No Pcp Per (Inactive) Consultants: Neurology Code Status: Full  Pt Overview and Major Events to Date:  6/2: Admitted  Assessment and Plan: Carl Armstrong is a 51 year old male presenting with left facial numbness.  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  Left facial numbness Patient was admitted due to left facial numbness shortly after TEE that has since resolved.  Currently being followed by neurology who recommend continuing aspirin and starting Brilinta.  On exam has mildly diminished dorsiflexion of left foot with diminished sensation. Repeat MRI showed no new infarct.  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  Home medication include HCTZ 25 mg daily -Continue home medication -Monitor BP with routine vitals  HLD Chronic and stable.  Home medication includes atorvastatin 40 mg daily -Continue home meds  Prediabetic Blood glucose this morning was116. -Heart healthy diet  FEN/GI: Heart healthy diet PPx: Lovenox Dispo:Home pending clinical improvement . Barriers include clinical status.   Subjective:  Patient was sleeping on arrival. Woke up to his name and reports doing well. Denies having any facial numbness since it resolved around 7pm last night. No complains.    Objective: Temp:  [97.7 F (36.5 C)-98.6 F (37 C)] 98.6 F (37 C) (06/02 2312) Pulse Rate:  [74-103] 76  (06/02 2312) Resp:  [16-24] 16 (06/02 2312) BP: (107-144)/(78-98) 131/88 (06/02 2312) SpO2:  [96 %-100 %] 100 % (06/02 2312) Weight:  [70.3 kg] 70.3 kg (06/02 0728) Physical Exam: General: Alert, well appearing, NAD CV: RRR, no murmurs, normal S1/S2 Pulm: CTAB, good WOB on RA, no crackles or wheezing Abd: Soft, no distension, no tenderness Ext: No BLE edema, diminished dorsiflexion of left foot with diminished sensation  Neuro: CN II-XII intact    Laboratory: Recent Labs  Lab 02/02/22 0552 02/03/22 0055 02/08/22 1749 02/08/22 1757  WBC 11.4* 10.3 7.6  --   HGB 13.0 13.3 13.8 15.6  HCT 40.3 41.9 43.3 46.0  PLT 357 397 324  --    Recent Labs  Lab 02/04/22 0109 02/05/22 0640 02/08/22 1749 02/08/22 1757  NA 139 135 139 138  K 3.9 3.9 3.7 3.9  CL 107 107 106 105  CO2 --   BUN CREATININE 1.56* 1.29* 1.37* 1.40*  CALCIUM 8.9 9.2 9.1  --   PROT  --   --  7.0  --   BILITOT  --   --  0.5  --   ALKPHOS  --   --  73  --   ALT  --   --  26  --   AST  --   --  29  --   GLUCOSE 83 87 98 94    Imaging/Diagnostic Tests: MR BRAIN WO CONTRAST  Result Date: 02/09/2022 CLINICAL DATA:  Follow-up examination for acute stroke. EXAM: MRI HEAD WITHOUT CONTRAST TECHNIQUE: Multiplanar, multiecho pulse sequences of the brain and surrounding structures were obtained without intravenous contrast. COMPARISON:  Prior brain MRI  from 02/02/2022. FINDINGS: Brain: Continued interval evolution of previously identified scattered right MCA territory infarcts. Again, these are watershed in distribution. Overall appearance is decreased from prior with diminished diffusion signal abnormality. Associated scattered small volume petechial blood products at the right parieto-occipital region without hemorrhagic transformation or significant regional mass effect Heidelberg classification 1a: HI1, scattered small petechiae, no mass effect. No evidence for new or interval infarction. Mild  scattered nonspecific T2/FLAIR hyperintensities noted within the periventricular white matter, stable. No mass lesion, mass effect or midline shift. No hydrocephalus or extra-axial fluid collection. No other new or progressive finding. Vascular: Major intracranial vascular flow voids are maintained. Skull and upper cervical spine: Craniocervical junction within normal limits. Bone marrow signal intensity within normal limits without focal marrow replacing lesion. No scalp soft tissue abnormality. Sinuses/Orbits: Globes orbital soft tissues within normal limits. Paranasal sinuses remain largely clear. No significant mastoid effusion. Other: None. IMPRESSION: 1. Continued interval evolution of previously identified watershed distribution right MCA infarcts. Associated small volume petechial blood products without hemorrhagic transformation or significant mass effect. 2. No new infarct or other abnormality. Electronically Signed   By: Rise Mu M.D.   On: 02/09/2022 04:30   ECHO TEE  Result Date: 02/08/2022    TRANSESOPHOGEAL ECHO REPORT   Patient Name:   Carl Armstrong Date of Exam: 02/08/2022 Medical Rec #:  [id number]    Height:       65.0 in Accession #:    [id number]   Weight:       155.0 lb Date of Birth:  April 30, 1971    BSA:          1.775 m Patient Age:    50 years     BP:           130/92 mmHg Patient Gender: M            HR:           82 bpm. Exam Location:  Inpatient Procedure: Transesophageal Echo, Cardiac Doppler and Color Doppler                                 MODIFIED REPORT: This report was modified by Chilton Si MD on 02/08/2022 due to bubble study.  Indications:     Stroke  History:         Patient has prior history of Echocardiogram examinations, most                  recent 02/03/2022. Risk Factors:Hypertension and Current Smoker.  Sonographer:     Ross Ludwig RDCS (AE) Referring Phys:  [id number] Cyndi Bender Diagnosing Phys: Chilton Si MD PROCEDURE: After discussion of the risks and  benefits of a TEE, an informed consent was obtained from the patient. The transesophogeal probe was passed without difficulty through the esophogus of the patient. Sedation performed by different physician. The patient was monitored while under deep sedation. Anesthestetic sedation was provided intravenously by Anesthesiology: 205.  of Propofol. Image quality was good. The patient's vital signs; including heart rate, blood pressure, and oxygen saturation; remained stable throughout the procedure. The patient developed no complications during the procedure. IMPRESSIONS  1. Left ventricular ejection fraction, by estimation, is 60 to 65%. The left ventricle has normal function. The left ventricle has no regional wall motion abnormalities.  2. Right ventricular systolic function is normal. The right ventricular size is normal.  3. No left atrial/left atrial appendage thrombus was  detected. The LAA emptying velocity was 88 cm/s.  4. The mitral valve is normal in structure. Trivial mitral valve regurgitation. No evidence of mitral stenosis.  5. The aortic valve is tricuspid. Aortic valve regurgitation is not visualized. No aortic stenosis is present.  6. The inferior vena cava is normal in size with greater than 50% respiratory variability, suggesting right atrial pressure of 3 mmHg.  7. Agitated saline contrast bubble study was negative, with no evidence of any interatrial shunt. Conclusion(s)/Recommendation(s): Normal biventricular function without evidence of hemodynamically significant valvular heart disease. FINDINGS  Left Ventricle: Left ventricular ejection fraction, by estimation, is 60 to 65%. The left ventricle has normal function. The left ventricle has no regional wall motion abnormalities. The left ventricular internal cavity size was normal in size. There is  no left ventricular hypertrophy. Right Ventricle: The right ventricular size is normal. No increase in right ventricular wall thickness. Right  ventricular systolic function is normal. Left Atrium: Left atrial size was normal in size. No left atrial/left atrial appendage thrombus was detected. The LAA emptying velocity was 88 cm/s. Right Atrium: Right atrial size was normal in size. Pericardium: There is no evidence of pericardial effusion. Mitral Valve: The mitral valve is normal in structure. Trivial mitral valve regurgitation. No evidence of mitral valve stenosis. Tricuspid Valve: The tricuspid valve is normal in structure. Tricuspid valve regurgitation is trivial. No evidence of tricuspid stenosis. Aortic Valve: The aortic valve is tricuspid. Aortic valve regurgitation is not visualized. No aortic stenosis is present. Pulmonic Valve: The pulmonic valve was normal in structure. Pulmonic valve regurgitation is trivial. No evidence of pulmonic stenosis. Aorta: The aortic root is normal in size and structure. Venous: The inferior vena cava is normal in size with greater than 50% respiratory variability, suggesting right atrial pressure of 3 mmHg. IAS/Shunts: No atrial level shunt detected by color flow Doppler. Agitated saline contrast was given intravenously to evaluate for intracardiac shunting. Agitated saline contrast bubble study was negative, with no evidence of any interatrial shunt. There  is no evidence of a patent foramen ovale. There is no evidence of an atrial septal defect. Chilton Siiffany Merrifield MD Electronically signed by Chilton Siiffany [place] MD Signature Date/Time: 02/08/2022/1:35:43 PM    Final (Updated)    CT HEAD CODE STROKE WO CONTRAST  Result Date: 02/08/2022 CLINICAL DATA:  Code stroke. Recent stroke last week, new left facial numbness starting at 4 p.m. EXAM: CT ANGIOGRAPHY HEAD AND NECK TECHNIQUE: Multidetector CT imaging of the head and neck was performed using the standard protocol during bolus administration of intravenous contrast. Multiplanar CT image reconstructions and MIPs were obtained to evaluate the vascular anatomy. Carotid  stenosis measurements (when applicable) are obtained utilizing NASCET criteria, using the distal internal carotid diameter as the denominator. RADIATION DOSE REDUCTION: This exam was performed according to the departmental dose-optimization program which includes automated exposure control, adjustment of the mA and/or kV according to patient size and/or use of iterative reconstruction technique. CONTRAST:  50 cc Omnipaque 350 COMPARISON:  MR head and CT/CTA 02/02/2022 FINDINGS: CT HEAD FINDINGS Brain: Small foci of hypodensity in the right cerebral hemisphere are seen corresponding to previously seen infarcts on the MRI of 02/02/2022. There is no evidence of hemorrhagic transformation of these infarcts There is no evidence of new acute infarct. There is no acute intracranial hemorrhage or extra-axial fluid collection. The ventricles are normal in size. There is no mass lesion. There is no mass effect or midline shift. Vascular: Postsurgical changes reflecting stent mediated coil  embolization of the right intracranial ICA aneurysm are again seen. No dense vessel sign is seen. Skull: Normal. Negative for fracture or focal lesion. Sinuses/Orbits: The paranasal sinuses are clear. Globes and orbits are unremarkable. Other: None. Review of the MIP images confirms the above findings CTA NECK FINDINGS Aortic arch: Aortic arch is normal. The origins of the major branch vessels are patent. The subclavian arteries are patent to the level imaged. Right carotid system: The right common, internal, and external carotid arteries are patent, without hemodynamically significant stenosis or occlusion. There is no dissection or aneurysm. Additionally, no carotid web is identified, as was suspected on the prior catheter angiogram. Left carotid system: Left common, internal, and external carotid arteries are patent, without hemodynamically significant stenosis or occlusion. There is no dissection or aneurysm. Vertebral arteries: The  vertebral arteries are patent, without hemodynamically significant stenosis or occlusion. There is no dissection or aneurysm. Skeleton: There is no acute osseous abnormality or suspicious osseous lesion. There is no visible canal hematoma. Other neck: The soft tissues of the neck are unremarkable. Upper chest: Emphysema is again seen in the lung apices. 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 The stent appears patent, though evaluation for in stent stenosis is limited by artifact from the stent. There is no definite evidence of recurrent aneurysm. The right M1 segment/communicating ICA is patent distal to the stent. Left intracranial ICA is patent. The bilateral MCAs are patent. The left A1 segment is hypoplastic or absent, likely developmental. Otherwise, the bilateral ACAs are patent There is no new aneurysm.  There is no AVM. Posterior circulation: The bilateral V4 segments are patent. PICA is identified bilaterally. The basilar artery is patent. The left PCA is patent. The proximal right PCA is suboptimally evaluated due to streak artifact from the stent. Within this confine, the right PCA appears patent. The posterior communicating arteries are not identified. There is no aneurysm or AVM. Venous sinuses: There is unchanged asymmetric non opacification of the right cavernous sinus. The venous structures are otherwise patent. Anatomic variants: None. Review of the MIP images confirms the above findings IMPRESSION: 1. Scattered evolving subacute infarcts in the right cerebral hemisphere as seen on the brain MRI of 05/027/2023. No definite new acute infarct. No acute intracranial hemorrhage. This was discussed with Dr. Wilford Corner at 6:01 pm. 2. Postprocedural changes reflecting stent mediated coil embolization of a right intracranial ICA aneurysm. The stent appears  patent but as before evaluation for in stent stenosis is limited by artifact. This was discussed with Dr Wilford Corner at 6:10 pm. 3. Otherwise, patent vasculature of the head and neck with no hemodynamically significant stenosis or occlusion. Specifically, no carotid web is seen on the right as was suspected on the prior catheter angiogram. Electronically Signed   By: Lesia Hausen M.D.   On: 02/08/2022 18:16   CT ANGIO HEAD NECK W WO CM (CODE STROKE)  Result Date: 02/08/2022 CLINICAL DATA:  Code stroke. Recent stroke last week, new left facial numbness starting at 4 p.m. EXAM: CT ANGIOGRAPHY HEAD AND NECK TECHNIQUE: Multidetector CT imaging of the head and neck was performed using the standard protocol during bolus administration of intravenous contrast. Multiplanar CT image reconstructions and MIPs were obtained to evaluate the vascular anatomy. Carotid stenosis measurements (when applicable) are obtained utilizing NASCET criteria, using the distal internal carotid diameter as the denominator. RADIATION DOSE REDUCTION:  This exam was performed according to the departmental dose-optimization program which includes automated exposure control, adjustment of the mA and/or kV according to patient size and/or use of iterative reconstruction technique. CONTRAST:  50 cc Omnipaque 350 COMPARISON:  MR head and CT/CTA 02/02/2022 FINDINGS: CT HEAD FINDINGS Brain: Small foci of hypodensity in the right cerebral hemisphere are seen corresponding to previously seen infarcts on the MRI of 02/02/2022. There is no evidence of hemorrhagic transformation of these infarcts There is no evidence of new acute infarct. There is no acute intracranial hemorrhage or extra-axial fluid collection. The ventricles are normal in size. There is no mass lesion. There is no mass effect or midline shift. 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 No dense vessel sign is  seen. Skull: Normal. Negative for fracture or focal lesion. Sinuses/Orbits: The paranasal sinuses are clear. Globes and orbits are unremarkable. Other: None. Review of the MIP images confirms the above findings CTA NECK FINDINGS Aortic arch: Aortic arch is normal. The origins of the major branch vessels are patent. The subclavian arteries are patent to the level imaged. Right carotid system: The right common, internal, and external carotid arteries are patent, without hemodynamically significant stenosis or occlusion. There is no dissection or aneurysm. Additionally, no carotid web is identified, as was suspected on the prior catheter angiogram. Left carotid system: Left common, internal, and external carotid arteries are patent, without hemodynamically significant stenosis or occlusion. There is no dissection or aneurysm. Vertebral arteries: The vertebral arteries are patent, without hemodynamically significant stenosis or occlusion. There is no dissection or aneurysm. Skeleton: There is no acute osseous abnormality or suspicious osseous lesion. There is no visible canal hematoma. Other neck: The soft tissues of the neck are unremarkable. Upper chest: Emphysema is again seen in the lung apices. 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 The stent appears patent, though evaluation for in stent stenosis is limited by artifact from the stent. There is no definite evidence of recurrent aneurysm. The right M1 segment/communicating ICA is patent distal to the stent. Left intracranial ICA is patent. The bilateral MCAs are patent. The left A1 segment is hypoplastic or absent, likely developmental. Otherwise, the bilateral ACAs are patent There is no new aneurysm.  There is no AVM. Posterior circulation: The bilateral V4 segments are patent. PICA is identified bilaterally. The  basilar artery is patent. The left PCA is patent. The proximal right PCA is suboptimally evaluated due to streak artifact from the stent. Within this confine, the right PCA appears patent. The posterior communicating arteries are not identified. There is no aneurysm or AVM. Venous sinuses: There is unchanged asymmetric non opacification of the right cavernous sinus. The venous structures are otherwise patent. Anatomic variants: None. Review of the MIP images confirms the above findings IMPRESSION: 1. Scattered evolving subacute infarcts in the right cerebral hemisphere as seen on the brain MRI of 05/027/2023. No definite new acute infarct. No acute intracranial hemorrhage. This was discussed with Dr. Wilford Corner at 6:01 pm. 2. Postprocedural changes reflecting stent mediated coil embolization of a right intracranial ICA aneurysm. The stent appears patent but as before evaluation for in stent stenosis is limited by artifact. This was discussed with Dr Wilford Corner at 6:10 pm. 3. Otherwise, patent vasculature of the head and neck with no hemodynamically significant stenosis or occlusion. Specifically,  no carotid web is seen on the right as was suspected on the prior catheter angiogram. Electronically Signed   By: Lesia Hausen M.D.   On: 02/08/2022 18:16     Jerre Simon, MD 02/09/2022, 12:41 AM PGY-1, [facility] Family Medicine FPTS Intern pager: [phone], text pages welcome

## 2022-02-09 NOTE — Progress Notes (Addendum)
Patient transported to MRI.  At 0350 patient back at room. Asleep.VS taken.

## 2022-02-09 NOTE — Plan of Care (Signed)
°  Problem: Education: °Goal: Knowledge of disease or condition will improve °Outcome: Adequate for Discharge °Goal: Knowledge of secondary prevention will improve (SELECT ALL) °Outcome: Adequate for Discharge °Goal: Knowledge of patient specific risk factors will improve (INDIVIDUALIZE FOR PATIENT) °Outcome: Adequate for Discharge °  °

## 2022-02-09 NOTE — Discharge Instructions (Signed)
Dear Carl Armstrong,   Thank you for letting us participate in your care! In this section, you will find a brief hospital admission summary of why you were admitted to the hospital, what happened during your admission, your diagnosis/diagnoses, and recommended follow up.   You were admitted because you were experiencing left facial numbness concerning for TIA. Your Brain MRI and Head CT were unchanged from your most recent visit.    POST-HOSPITAL & CARE INSTRUCTIONS Discontinue taking Plavix Your Plavix has been switched to Brilinta 90mg  twice daily Continue taking your Aspirin Please let PCP/Specialists know of any changes in medications that were made.  Please see medications section of this packet for any medication changes.   DOCTOR'S APPOINTMENTS & FOLLOW UP Future Appointments  Date Time Provider Department Center  02/18/2022  1:45 PM 04/20/2022, MD [facility]  03/19/2022  9:30 AM 05/20/2022, MD [facility] None     Thank you for choosing [facility]! Take care and be well!  Family Medicine Teaching Service Inpatient Team [place]  [facility]  [address] [place], [place] [phone]

## 2022-02-11 ENCOUNTER — Encounter (HOSPITAL_COMMUNITY): Payer: Self-pay | Admitting: Cardiovascular Disease

## 2022-02-15 ENCOUNTER — Ambulatory Visit: Payer: BLUE CROSS/BLUE SHIELD

## 2022-02-18 ENCOUNTER — Ambulatory Visit (INDEPENDENT_AMBULATORY_CARE_PROVIDER_SITE_OTHER): Payer: BLUE CROSS/BLUE SHIELD | Admitting: Student

## 2022-02-18 ENCOUNTER — Other Ambulatory Visit: Payer: Self-pay | Admitting: *Deleted

## 2022-02-18 ENCOUNTER — Encounter: Payer: Self-pay | Admitting: Student

## 2022-02-18 VITALS — BP 124/76 | HR 73 | Ht 65.0 in | Wt 156.2 lb

## 2022-02-18 DIAGNOSIS — M792 Neuralgia and neuritis, unspecified: Secondary | ICD-10-CM

## 2022-02-18 DIAGNOSIS — I63511 Cerebral infarction due to unspecified occlusion or stenosis of right middle cerebral artery: Secondary | ICD-10-CM | POA: Diagnosis not present

## 2022-02-18 DIAGNOSIS — I10 Essential (primary) hypertension: Secondary | ICD-10-CM | POA: Diagnosis not present

## 2022-02-18 DIAGNOSIS — I63411 Cerebral infarction due to embolism of right middle cerebral artery: Secondary | ICD-10-CM | POA: Diagnosis not present

## 2022-02-18 MED ORDER — PREGABALIN 25 MG PO CAPS: 25.0000 mg | ORAL_CAPSULE | Freq: Two times a day (BID) | ORAL | 0 refills | Status: AC

## 2022-02-18 NOTE — Progress Notes (Signed)
    SUBJECTIVE:   CHIEF COMPLAINT / HPI: Hospital Follow Up  Recent admission discharge summary: "Patient presenting with new left facial numbness after TEE procedure that has since resolved.  CT head, CT angiogram and Brain MRI show no acute change. Patient on exam still have residual weakness with dorsiflexion of his left foot with diminished sensation. Was discharged last with with Aspirin and Plavix. However given new symptom switched Plavix to Brilinta 90 mg BID per neurology." Had recent acute infarct of R cerebral convexity, MCA watershed region.    He is meeting with neurology on the 11th and he is he has not gotten any Zio patch monitor yet.  F/u Recs switched to Brilinta + ASA-patient is adhering to this regimen without issues  HTN Taking HCTZ 25 mg daily. BP appropriate in clinic today.  Left foot pain No issues with dorsiflexion or plantarflexion in his no issues with gait but does have a sharp pain on the dorsal surface of his foot.  No sensation loss.  Says that the pain feels like needles.  Says in the past he has taken Lyrica for this and would like to try this again.  Says this has been going on since his first admission with stroke.  PERTINENT  PMH / PSH: stroke, HTN, prediabetes  OBJECTIVE:   BP 124/76   Pulse 73   Ht 5\' 5"  (1.651 m)   Wt 156 lb 3.2 oz (70.9 kg)   SpO2 98%   BMI 25.99 kg/m    General: Well appearing, NAD, awake, alert, responsive to questions Head: Normocephalic atraumatic CV: Regular rate and rhythm no murmurs rubs or gallops Respiratory: Clear to ausculation bilaterally, no wheezes rales or crackles, chest rises symmetrically,  no increased work of breathing Extremities: Moves upper and lower extremities freely, no edema in LE, no issues with gait, 2+ pulses in lower extremities, brisk capillary refill, sensation intact in feet Neuro: CN II: PERRL CN III, IV,VI: EOMI CV V: Normal sensation in V1, V2, V3 CVII: Symmetric smile and brow  raise CN VIII: Normal hearing CN IX,X: Symmetric palate raise  CN XI: 5/5 shoulder shrug CN XII: Symmetric tongue protrusion  UE and LE strength 5/5 Normal sensation in UE and LE bilaterally   ASSESSMENT/PLAN:   Stroke [facility]) Patient does not have any new neurologic symptoms today.  Has been adhering to Brilinta twice daily and aspirin daily as well as atorvastatin daily.  We will plan to recheck LDL after adhering for a while.  Patient is following up with neurology on the 11th.  Discussed that he should reach out to the neurologist about setting up 30-day ZIO patch monitor that was recommended.  Primary hypertension Blood pressure 124/76 has been adhering to HCTZ 25 mg daily.  Denies any headaches. -BMP  Neuropathic pain Patient said in the past he has been on Lyrica which has helped.  Most likely neuropathic pain in nature.  Has good perfusion of foot and good pulses in lower extremity.  Has good sensation as well. -Lyrica 25 mg twice daily, can titrate up as necessary    12, MD [facility]

## 2022-02-18 NOTE — Patient Instructions (Addendum)
It was great to see you! Thank you for allowing me to participate in your care!   I recommend that you always bring your medications to each appointment as this makes it easy to ensure we are on the correct medications and helps [place] not miss when refills are needed.  Our plans for today:  - I filled lyrica 25 mg twice daily for your foot pain - Please follow up with neurology  We are checking some labs today, I will call you if they are abnormal will send you a MyChart message or a letter if they are normal.  If you do not hear about your labs in the next 2 weeks please let us know.  Take care and seek immediate care sooner if you develop any concerns. Please remember to show up 15 minutes before your scheduled appointment time!  Levin Erp, MD [facility]

## 2022-02-18 NOTE — Assessment & Plan Note (Signed)
Patient does not have any new neurologic symptoms today.  Has been adhering to Brilinta twice daily and aspirin daily as well as atorvastatin daily.  We will plan to recheck LDL after adhering for a while.  Patient is following up with neurology on the 11th.  Discussed that he should reach out to the neurologist about setting up 30-day ZIO patch monitor that was recommended.

## 2022-02-18 NOTE — Assessment & Plan Note (Signed)
Patient said in the past he has been on Lyrica which has helped.  Most likely neuropathic pain in nature.  Has good perfusion of foot and good pulses in lower extremity.  Has good sensation as well. -Lyrica 25 mg twice daily, can titrate up as necessary

## 2022-02-18 NOTE — Assessment & Plan Note (Signed)
Blood pressure 124/76 has been adhering to HCTZ 25 mg daily.  Denies any headaches. -BMP

## 2022-02-18 NOTE — Patient Outreach (Signed)
[facility]) Care Management  02/18/2022  Carl Armstrong 1971/09/04 [id number]   RED ON EMMI ALERT - Stroke Day # 9 Date: 6/9 Red Alert Reason: Feeling sad/hopeless/empty/anxious?  YES   Outreach attempt #1, successful.  Identity verified.  This care manager introduced self and stated purpose of call.  [facility] care management services explained.    Report he is doing well, has wife in the home for support.  Referral was placed to outpatient rehab center in [place], but state they do not accept his insurance.  Advised to call insurance to obtain list of rehab centers that are in network and choose from list.  Has not been monitoring blood pressure, does not have machine.    State he is taking medications as prescribed, was able to use voucher to pay for Brilinta.  He will notify this RNCM if cost is an issue in the future.  Has follow up appointment scheduled wit new primary provider today at [facility], also state he has appointment with PCP office with [facility] tomorrow.  Report the [facility] system is closer to his home, will make a final decision on which PCP office to use after these appointments.  Follow up with neurology office on 7/11.    Inquired about feelings due to above noted alert, state he has had a history of depression, working on this again since his stroke.  PHQ2 completed, result is 1.  Discussed possibly having CSW contact him for counseling, he declines.  State he will discuss options with insurance company.    Plan: RN CM will send eduction regarding stroke recovery and smoking cessation.  Will send blood pressure monitor.  Will follow up within the next 2 weeks.  Kemper Durie, RN, MSN, CCM [facility] Care Management  [facility] Manager [phone]

## 2022-02-18 NOTE — Patient Outreach (Signed)
Received a red flag Emmi stroke notification for Mr. Nifong . I have assigned Kemper Durie, RN to call for follow up and determine if there are any Case Management needs.    Iverson Alamin, Donivan Scull [facility] Care Management Assistant [facility] Care Management [phone]

## 2022-02-19 LAB — BASIC METABOLIC PANEL
BUN/Creatinine Ratio: 14 (ref 9–20)
BUN: 17 mg/dL (ref 6–24)
CO2: 26 mmol/L (ref 20–29)
Calcium: 10 mg/dL (ref 8.7–10.2)
Chloride: 101 mmol/L (ref 96–106)
Creatinine, Ser: 1.23 mg/dL (ref 0.76–1.27)
Glucose: 128 mg/dL — ABNORMAL HIGH (ref 70–99)
Potassium: 3.8 mmol/L (ref 3.5–5.2)
Sodium: 142 mmol/L (ref 134–144)
eGFR: 72 mL/min/{1.73_m2} (ref >59)

## 2022-03-06 ENCOUNTER — Other Ambulatory Visit: Payer: Self-pay | Admitting: *Deleted

## 2022-03-06 NOTE — Patient Outreach (Signed)
[facility]) Care Management  03/06/2022  Dayten Juba [birth year] [id number]   Outgoing call placed to member, successful.  State he is doing well, denies any further questions regarding discharge or stroke recovery.  Denies any urgent concerns, encouraged to contact this care manager with questions.  Will close case at this time.  Kemper Durie, RN, MSN, CCM [facility] Care Management  [facility] Manager [phone]

## 2022-03-07 ENCOUNTER — Ambulatory Visit: Payer: BLUE CROSS/BLUE SHIELD | Admitting: *Deleted

## 2022-03-18 ENCOUNTER — Other Ambulatory Visit: Payer: Self-pay

## 2022-03-18 MED ORDER — ATORVASTATIN CALCIUM 40 MG PO TABS: 40.0000 mg | ORAL_TABLET | Freq: Every day | ORAL | 1 refills | Status: DC

## 2022-03-18 MED ORDER — TICAGRELOR 90 MG PO TABS: 90.0000 mg | ORAL_TABLET | Freq: Two times a day (BID) | ORAL | 0 refills | Status: AC

## 2022-03-19 ENCOUNTER — Ambulatory Visit (INDEPENDENT_AMBULATORY_CARE_PROVIDER_SITE_OTHER): Payer: BLUE CROSS/BLUE SHIELD | Admitting: Neurology

## 2022-03-19 ENCOUNTER — Telehealth: Payer: Self-pay | Admitting: Neurology

## 2022-03-19 ENCOUNTER — Encounter: Payer: Self-pay | Admitting: Neurology

## 2022-03-19 VITALS — BP 139/95 | HR 84 | Ht 65.0 in | Wt 152.0 lb

## 2022-03-19 DIAGNOSIS — I63411 Cerebral infarction due to embolism of right middle cerebral artery: Secondary | ICD-10-CM | POA: Diagnosis not present

## 2022-03-19 MED ORDER — CLOPIDOGREL BISULFATE 75 MG PO TABS: 75.0000 mg | ORAL_TABLET | Freq: Every day | ORAL | 3 refills | Status: AC

## 2022-03-19 MED ORDER — ATORVASTATIN CALCIUM 40 MG PO TABS
40.0000 mg | ORAL_TABLET | Freq: Every day | ORAL | 3 refills | Status: AC
Start: 2022-03-19 — End: 2023-03-14

## 2022-03-19 NOTE — Progress Notes (Signed)
Chief Complaint  Patient presents with   New Patient (Initial Visit)    EMG 4, alone. Doing well post stroke. Mild L Leg limp.       ASSESSMENT AND PLAN  Carl Armstrong is a 51 y.o. 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 Pearlean Brownie in 6 months  DIAGNOSTIC DATA (LABS, IMAGING, TESTING) - I reviewed patient records, labs, notes, testing and imaging myself where available.   MEDICAL HISTORY:  Carl Armstrong, is a 51 year old male seen in request by Dr.   Marvel Plan to follow-up for hospital discharge for stroke,  I reviewed and summarized the referring note. PMHX. 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, he presented with with sudden onset explosive severe headache, there was no focal deficit  He has been followed by [facility] radiologist on a yearly basis, recent MRI of the brain December 2021 showed aneurysm calling at the right ICA terminus, right ICA communicating segment without evidence of residual aneurysm filling,  he was taking aspirin 81 mg daily  Reported TIA episode around February 2023, sudden onset of left side arm and leg numbness, weakness, lasting for 30 minutes, was evaluated at [facility] emergency room, no abnormality found,  Hospital admission on Feb 02, 2022 for sudden onset left leg more than arm weakness, MRI of the brain showed acute infarction involving watershed right MCA distribution, involving right superior frontal, parietal, occipital cortex  CT angiogram and later four-vessel angiogram showed no evidence of stenosis of the intraluminal filling defect of the right internal carotid artery supraclinoid segment 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, 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 with primary calling without coil compaction or recanalization  Fine linear lucency in the right internal carotid artery bulb suspicious for carotid plaque,  He was discharged with aspirin 81 mg plus Plavix 75 mg daily  He regained significant recovery, only mild left foot weakness,  Hospital presentation on February 08, 2022 for transient left facial numbness, lasting for few minutes  Repeat MRI of the brain February 09, 2022 showed no new lesions  TEE and echocardiogram showed no significant abnormality,  He was discharged with aspirin 81 mg with Brilinta 90 mg twice a day  He has been doing well, steady recovery of left-sided weakness,   PHYSICAL EXAM:   Vitals:   03/19/22 0944  BP: (!) 139/95  Pulse: 84  Weight: 152 lb (68.9 kg)  Height: 5\' 5"  (1.651 m)   Body mass index is 25.29 kg/m.  PHYSICAL EXAMNIATION:  Gen: NAD, conversant, well  nourised, well groomed                     Cardiovascular: Regular rate rhythm, no peripheral edema, warm, nontender. Eyes: Conjunctivae clear without exudates or hemorrhage Neck: Supple, no carotid bruits. Pulmonary: Clear to auscultation bilaterally   NEUROLOGICAL EXAM:  MENTAL  STATUS: Speech/cognition: Awake, alert, oriented to history taking and casual conversation CRANIAL NERVES: CN II: Visual fields are full to confrontation. Pupils are round equal and briskly reactive to light. CN III, IV, VI: extraocular movement are normal. No ptosis. CN V: Facial sensation is intact to light touch CN VII: Face is symmetric with normal eye closure  CN VIII: Hearing is normal to causal conversation. CN IX, X: Phonation is normal. CN XI: Head turning and shoulder shrug are intact  MOTOR: Mild left toe extension flexion weakness  REFLEXES: Brisk reflex at left upper and lower extremity left-sided Babinski sign  SENSORY: Intact to light touch, pinprick and vibratory sensation are intact in fingers and toes.  COORDINATION: There is no trunk or limb dysmetria noted.  GAIT/STANCE: Steady gait, mild difficulty with left side heel and tiptoe walking   REVIEW OF SYSTEMS:  Full 14 system review of systems performed and notable only for as above All other review of systems were negative.   ALLERGIES: No Known Allergies  HOME MEDICATIONS: Current Outpatient Medications  Medication Sig Dispense Refill   aspirin EC 81 MG tablet Take 81 mg by mouth daily. Swallow whole.     atorvastatin (LIPITOR) 40 MG tablet Take 1 tablet (40 mg total) by mouth daily. 30 tablet 1   hydrochlorothiazide (HYDRODIURIL) 25 MG tablet Take 25 mg by mouth daily.     potassium chloride SA (KLOR-CON M) 20 MEQ tablet Take 20 mEq by mouth 2 (two) times daily.     pregabalin (LYRICA) 25 MG capsule Take 1 capsule (25 mg total) by mouth 2 (two) times daily. 60 capsule 0   ticagrelor (BRILINTA) 90 MG TABS tablet Take 1 tablet (90 mg total) by mouth 2 (two) times daily. 60 tablet 0   No current facility-administered medications for this visit.    PAST MEDICAL HISTORY: Past Medical History:  Diagnosis Date   Brain aneurysm 2017   Hypertension    TIA (transient ischemic attack)     PAST SURGICAL  HISTORY: Past Surgical History:  Procedure Laterality Date   BRAIN SURGERY     BUBBLE STUDY  02/08/2022   Procedure: BUBBLE STUDY;  Surgeon: Chilton Si, MD;  Location: [facility] ENDOSCOPY;  Service: Cardiovascular;;   IR ANGIO INTRA EXTRACRAN SEL COM CAROTID INNOMINATE BILAT MOD SED  02/05/2022   IR ANGIO VERTEBRAL SEL VERTEBRAL BILAT MOD SED  02/05/2022   IR US GUIDE VASC ACCESS RIGHT  02/05/2022   TEE WITHOUT CARDIOVERSION N/A 02/08/2022   Procedure: TRANSESOPHAGEAL ECHOCARDIOGRAM (TEE);  Surgeon: Chilton Si, MD;  Location: [facility] ENDOSCOPY;  Service: Cardiovascular;  Laterality: N/A;    FAMILY HISTORY: History reviewed. No pertinent family history.  SOCIAL HISTORY: Social History   Socioeconomic History   Marital status: Married    Spouse name: Not on file   Number of children: Not on file   Years of education: Not on file   Highest education level: Not on file  Occupational History   Not on file  Tobacco Use   Smoking status: Every Day    Types: Cigarettes   Smokeless tobacco: Never  Substance and Sexual Activity   Alcohol use: Not Currently  Drug use: Not Currently   Sexual activity: Yes  Other Topics Concern   Not on file  Social History Narrative   Not on file   Social Determinants of Health   Financial Resource Strain: Not on file  Food Insecurity: Not on file  Transportation Needs: Not on file  Physical Activity: Not on file  Stress: Not on file  Social Connections: Not on file  Intimate Partner Violence: Not on file     Levert Feinstein, M.D. Ph.D.  [facility] [address], [address] [place], [place] [address] Ph: [phone] Fax: [phone]  CC:  Marvel Plan, MD [address] [place],  [place] [address]  Patient, No Pcp Per

## 2022-03-19 NOTE — Telephone Encounter (Signed)
When the order is placed for a cardiac monitor it goes straight to [facility] and they automatically mail the monitor to the patient.

## 2022-03-19 NOTE — Telephone Encounter (Signed)
Please make sure that he is on schedule for 30 days cardiac monitoring, order was placed.

## 2022-06-02 ENCOUNTER — Other Ambulatory Visit: Payer: Self-pay | Admitting: Student

## 2022-07-09 ENCOUNTER — Other Ambulatory Visit: Payer: Self-pay

## 2022-07-09 ENCOUNTER — Other Ambulatory Visit: Payer: Self-pay | Admitting: Student

## 2022-07-09 MED ORDER — HYDROCHLOROTHIAZIDE 25 MG PO TABS: 25.0000 mg | ORAL_TABLET | Freq: Every day | ORAL | 0 refills | Status: AC

## 2022-07-09 NOTE — Telephone Encounter (Signed)
Patient calls nurse line requesting a refill on HCTZ.   Patient saw Jinny Sanders in June. He reports he lives in [place] and is actively trying to find a PCP.   Patient asking for a refill.

## 2022-09-19 ENCOUNTER — Ambulatory Visit: Payer: BLUE CROSS/BLUE SHIELD | Admitting: Neurology

## 2023-04-10 ENCOUNTER — Other Ambulatory Visit: Payer: Self-pay | Admitting: Student
